# Patient Record
Sex: Male | Born: 1980 | Race: Black or African American | Hispanic: No | Marital: Single | Smoking: Never smoker
Health system: Southern US, Community
[De-identification: ages and names within clinical notes are randomized; demographics above are authoritative.]

---

## 2002-07-15 ENCOUNTER — Encounter: Payer: Self-pay | Admitting: Emergency Medicine

## 2002-07-15 ENCOUNTER — Emergency Department (HOSPITAL_COMMUNITY): Admission: EM | Admit: 2002-07-15 | Discharge: 2002-07-15 | Payer: Self-pay | Admitting: Emergency Medicine

## 2005-01-28 ENCOUNTER — Ambulatory Visit: Payer: Self-pay | Admitting: Family Medicine

## 2005-03-28 ENCOUNTER — Emergency Department (HOSPITAL_COMMUNITY): Admission: EM | Admit: 2005-03-28 | Discharge: 2005-03-29 | Payer: Self-pay | Admitting: Emergency Medicine

## 2005-07-10 ENCOUNTER — Emergency Department (HOSPITAL_COMMUNITY): Admission: EM | Admit: 2005-07-10 | Discharge: 2005-07-10 | Payer: Self-pay | Admitting: Emergency Medicine

## 2005-11-10 ENCOUNTER — Ambulatory Visit: Payer: Self-pay | Admitting: Family Medicine

## 2007-01-04 ENCOUNTER — Ambulatory Visit: Payer: Self-pay | Admitting: Family Medicine

## 2007-01-04 LAB — CONVERTED CEMR LAB
ALT: 20 units/L (ref 0–40)
AST: 22 units/L (ref 0–37)
Albumin: 4.1 g/dL (ref 3.5–5.2)
Alkaline Phosphatase: 67 units/L (ref 39–117)
BUN: 14 mg/dL (ref 6–23)
Bilirubin, Direct: 0.2 mg/dL (ref 0.0–0.3)
CO2: 30 meq/L (ref 19–32)
Calcium: 9.2 mg/dL (ref 8.4–10.5)
Chloride: 107 meq/L (ref 96–112)
Cholesterol: 167 mg/dL (ref 0–200)
Creatinine, Ser: 1.4 mg/dL (ref 0.4–1.5)
GFR calc Af Amer: 78 mL/min
GFR calc non Af Amer: 65 mL/min
Glucose, Bld: 94 mg/dL (ref 70–99)
HCT: 41.9 % (ref 39.0–52.0)
HDL: 36.5 mg/dL — ABNORMAL LOW (ref >39.0)
Hemoglobin: 13.7 g/dL (ref 13.0–17.0)
LDL Cholesterol: 121 mg/dL — ABNORMAL HIGH (ref 0–99)
MCHC: 32.7 g/dL (ref 30.0–36.0)
MCV: 77.4 fL — ABNORMAL LOW (ref 78.0–100.0)
Platelets: 190 10*3/uL (ref 150–400)
Potassium: 3.9 meq/L (ref 3.5–5.1)
RBC: 5.41 M/uL (ref 4.22–5.81)
RDW: 13.1 % (ref 11.5–14.6)
Sodium: 140 meq/L (ref 135–145)
TSH: 0.73 microintl units/mL (ref 0.35–5.50)
Total Bilirubin: 1.1 mg/dL (ref 0.3–1.2)
Total CHOL/HDL Ratio: 4.6
Total Protein: 7.8 g/dL (ref 6.0–8.3)
Triglycerides: 50 mg/dL (ref 0–149)
VLDL: 10 mg/dL (ref 0–40)
WBC: 4.2 10*3/uL — ABNORMAL LOW (ref 4.5–10.5)

## 2008-11-25 ENCOUNTER — Emergency Department (HOSPITAL_COMMUNITY): Admission: EM | Admit: 2008-11-25 | Discharge: 2008-11-25 | Payer: Self-pay | Admitting: Emergency Medicine

## 2009-08-10 ENCOUNTER — Emergency Department (HOSPITAL_COMMUNITY): Admission: EM | Admit: 2009-08-10 | Discharge: 2009-08-10 | Payer: Self-pay | Admitting: Emergency Medicine

## 2010-03-02 ENCOUNTER — Encounter: Payer: Self-pay | Admitting: Family Medicine

## 2010-03-11 ENCOUNTER — Encounter: Admission: RE | Admit: 2010-03-11 | Discharge: 2010-03-11 | Payer: Self-pay | Admitting: Otolaryngology

## 2010-04-07 ENCOUNTER — Encounter: Payer: Self-pay | Admitting: Family Medicine

## 2010-04-17 ENCOUNTER — Emergency Department (HOSPITAL_COMMUNITY)
Admission: EM | Admit: 2010-04-17 | Discharge: 2010-04-17 | Payer: Self-pay | Source: Home / Self Care | Admitting: Emergency Medicine

## 2010-04-21 ENCOUNTER — Encounter: Payer: Self-pay | Admitting: Family Medicine

## 2010-05-17 ENCOUNTER — Encounter: Payer: Self-pay | Admitting: Family Medicine

## 2010-05-24 ENCOUNTER — Ambulatory Visit (HOSPITAL_BASED_OUTPATIENT_CLINIC_OR_DEPARTMENT_OTHER): Admission: RE | Admit: 2010-05-24 | Discharge: 2010-05-24 | Payer: Self-pay | Admitting: Otolaryngology

## 2010-06-01 ENCOUNTER — Encounter: Payer: Self-pay | Admitting: Family Medicine

## 2010-06-15 ENCOUNTER — Encounter: Payer: Self-pay | Admitting: Family Medicine

## 2010-07-06 ENCOUNTER — Encounter: Payer: Self-pay | Admitting: Family Medicine

## 2010-08-10 ENCOUNTER — Ambulatory Visit: Payer: Self-pay | Admitting: Family Medicine

## 2010-08-12 ENCOUNTER — Encounter: Payer: Self-pay | Admitting: Family Medicine

## 2010-09-14 NOTE — Letter (Signed)
Summary: [facility]  [facility]   Imported By: Maryln Gottron 05/03/2010 09:53:39  _____________________________________________________________________  External Attachment:    Type:   Image     Comment:   External Document

## 2010-09-14 NOTE — Consult Note (Signed)
Summary: [place] [facility]  [facility]   Imported By: Maryln Gottron 05/24/2010 11:17:07  _____________________________________________________________________  External Attachment:    Type:   Image     Comment:   External Document

## 2010-09-14 NOTE — Letter (Signed)
Summary: [place] [facility]  [facility]   Imported By: Maryln Gottron 04/16/2010 09:16:46  _____________________________________________________________________  External Attachment:    Type:   Image     Comment:   External Document

## 2010-09-14 NOTE — Letter (Signed)
Summary: [facility]  [facility]   Imported By: Maryln Gottron 06/18/2010 14:42:47  _____________________________________________________________________  External Attachment:    Type:   Image     Comment:   External Document

## 2010-09-14 NOTE — Letter (Signed)
Summary: [facility]  [facility]   Imported By: Maryln Gottron 07/15/2010 12:58:44  _____________________________________________________________________  External Attachment:    Type:   Image     Comment:   External Document

## 2010-09-14 NOTE — Consult Note (Signed)
Summary: [facility]  [facility]   Imported By: Maryln Gottron 06/08/2010 13:57:15  _____________________________________________________________________  External Attachment:    Type:   Image     Comment:   External Document

## 2010-09-14 NOTE — Consult Note (Signed)
Summary:  [facility]  [facility]   Imported By: Maryln Gottron 03/08/2010 12:31:16  _____________________________________________________________________  External Attachment:    Type:   Image     Comment:   External Document

## 2010-09-16 NOTE — Letter (Signed)
Summary: [facility]  [facility]   Imported By: Maryln Gottron 08/25/2010 13:05:12  _____________________________________________________________________  External Attachment:    Type:   Image     Comment:   External Document

## 2010-10-28 LAB — POCT HEMOGLOBIN-HEMACUE: Hemoglobin: 12.3 g/dL — ABNORMAL LOW (ref 13.0–17.0)

## 2010-12-27 ENCOUNTER — Emergency Department (HOSPITAL_COMMUNITY)
Admission: EM | Admit: 2010-12-27 | Discharge: 2010-12-27 | Disposition: A | Discharge status: Home / Self Care | Payer: No Typology Code available for payment source | Attending: Emergency Medicine | Admitting: Emergency Medicine

## 2010-12-27 DIAGNOSIS — M25519 Pain in unspecified shoulder: Secondary | ICD-10-CM | POA: Insufficient documentation

## 2010-12-27 DIAGNOSIS — M546 Pain in thoracic spine: Secondary | ICD-10-CM | POA: Insufficient documentation

## 2010-12-27 DIAGNOSIS — R51 Headache: Secondary | ICD-10-CM | POA: Insufficient documentation

## 2010-12-27 DIAGNOSIS — T148XXA Other injury of unspecified body region, initial encounter: Secondary | ICD-10-CM | POA: Insufficient documentation

## 2010-12-27 DIAGNOSIS — M542 Cervicalgia: Secondary | ICD-10-CM | POA: Insufficient documentation

## 2010-12-27 DIAGNOSIS — M2560 Stiffness of unspecified joint, not elsewhere classified: Secondary | ICD-10-CM | POA: Insufficient documentation

## 2010-12-28 NOTE — Assessment & Plan Note (Signed)
[facility]                            [facility] OFFICE NOTE   NAME:Gipson, DAVEON                       MRN:          [id number]  DATE:01/04/2007                            DOB:          Dec 23, 1980    This is a 30 year old gentleman here complete physical exam.   In general, he feels fine.  He does ask a question about how to treat  hair loss.  Over the past year, he has had rapidly thinning hair around  the temples and on the top of his head.  Otherwise, for details of his  past medical history, family history, social history, habits refer to  our list with physical note dated Feb 07, 2005.   ALLERGIES:  No known drug allergies.   MEDICATIONS:  None.   OBJECTIVE:  VITAL SIGNS:  Height 5 feet 6 inches, weight 187, BP 108/82,  pulse 76 and regular.  GENERAL:  He appears to be healthy.  SKIN:  Clear.  He does have some thinning of his hair at the apex of the  scalp as well as the temples.  HEENT:  Eyes clear.  Oropharynx clear.  NECK:  Supple without lymphadenopathy or masses.  LUNGS:  Clear.  CARDIAC:  Regular rate and rhythm without murmurs, rubs or gallops.  Pulses full.  ABDOMEN:  Soft, normal bowel sounds, nontender, no masses.  GENITALIA:  Circumcised.  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  EXTREMITIES:  No clubbing, cyanosis or edema.  NEUROLOGIC:  Grossly intact.   ASSESSMENT/PLAN:  1. Complete physical.  He is fasting, so we will get the usual      laboratories.  2. Genital wart.  Will begin Aldara 5% cream to apply every other day      until clear.  We advised him to use condoms in the meantime.  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  Will begin Propecia 1 mg per day.     Tera Mater. Clent Ridges, MD  Electronically Signed    SAF/MedQ  DD: 01/04/2007  DT: 01/04/2007  Job #: [id number]

## 2010-12-31 ENCOUNTER — Emergency Department (HOSPITAL_COMMUNITY)
Admission: EM | Admit: 2010-12-31 | Discharge: 2010-12-31 | Disposition: A | Discharge status: Home / Self Care | Payer: No Typology Code available for payment source | Attending: Emergency Medicine | Admitting: Emergency Medicine

## 2010-12-31 DIAGNOSIS — M549 Dorsalgia, unspecified: Secondary | ICD-10-CM | POA: Insufficient documentation

## 2010-12-31 DIAGNOSIS — T148XXA Other injury of unspecified body region, initial encounter: Secondary | ICD-10-CM | POA: Insufficient documentation

## 2010-12-31 DIAGNOSIS — Y92410 Unspecified street and highway as the place of occurrence of the external cause: Secondary | ICD-10-CM | POA: Insufficient documentation

## 2011-02-20 ENCOUNTER — Emergency Department (HOSPITAL_COMMUNITY)
Admission: EM | Admit: 2011-02-20 | Discharge: 2011-02-20 | Disposition: A | Discharge status: Home / Self Care | Payer: PRIVATE HEALTH INSURANCE | Attending: Emergency Medicine | Admitting: Emergency Medicine

## 2011-02-20 ENCOUNTER — Emergency Department (HOSPITAL_COMMUNITY): Payer: PRIVATE HEALTH INSURANCE

## 2011-02-20 DIAGNOSIS — R059 Cough, unspecified: Secondary | ICD-10-CM | POA: Insufficient documentation

## 2011-02-20 DIAGNOSIS — R079 Chest pain, unspecified: Secondary | ICD-10-CM | POA: Insufficient documentation

## 2011-02-20 DIAGNOSIS — R0602 Shortness of breath: Secondary | ICD-10-CM | POA: Insufficient documentation

## 2011-02-20 DIAGNOSIS — J40 Bronchitis, not specified as acute or chronic: Secondary | ICD-10-CM | POA: Insufficient documentation

## 2011-02-20 DIAGNOSIS — R05 Cough: Secondary | ICD-10-CM | POA: Insufficient documentation

## 2012-12-06 ENCOUNTER — Ambulatory Visit (INDEPENDENT_AMBULATORY_CARE_PROVIDER_SITE_OTHER): Payer: BC Managed Care – PPO | Admitting: Family Medicine

## 2012-12-06 ENCOUNTER — Encounter: Payer: Self-pay | Admitting: Family Medicine

## 2012-12-06 VITALS — BP 120/84 | HR 86 | Temp 98.6°F | Wt 215.0 lb

## 2012-12-06 DIAGNOSIS — J019 Acute sinusitis, unspecified: Secondary | ICD-10-CM

## 2012-12-06 MED ORDER — AZITHROMYCIN 250 MG PO TABS: ORAL_TABLET | ORAL | Status: DC

## 2012-12-06 NOTE — Progress Notes (Signed)
  Subjective:    Patient ID: Curtis Parks, male    DOB: [birth year], 32 y.o.   MRN: [id number]  HPI Here for one week of sinus pressure, PND, ST, and a dry cough. No fever. On Claritin.    Review of Systems  Constitutional: Negative.   HENT: Positive for congestion, postnasal drip and sinus pressure.   Eyes: Negative.   Respiratory: Positive for cough.        Objective:   Physical Exam  Constitutional: He appears well-developed and well-nourished.  HENT:  Right Ear: External ear normal.  Left Ear: External ear normal.  Nose: Nose normal.  Mouth/Throat: Oropharynx is clear and moist.  Eyes: Conjunctivae are normal.  Pulmonary/Chest: Effort normal and breath sounds normal.  Lymphadenopathy:    He has no cervical adenopathy.          Assessment & Plan:  Recheck prn

## 2014-02-28 ENCOUNTER — Ambulatory Visit (INDEPENDENT_AMBULATORY_CARE_PROVIDER_SITE_OTHER): Payer: BC Managed Care – PPO | Admitting: Family Medicine

## 2014-02-28 ENCOUNTER — Encounter: Payer: Self-pay | Admitting: Family Medicine

## 2014-02-28 VITALS — BP 135/80 | HR 76 | Temp 98.4°F | Ht 66.0 in | Wt 219.0 lb

## 2014-02-28 DIAGNOSIS — J0100 Acute maxillary sinusitis, unspecified: Secondary | ICD-10-CM

## 2014-02-28 MED ORDER — AZITHROMYCIN 250 MG PO TABS: ORAL_TABLET | ORAL | Status: DC

## 2014-02-28 NOTE — Progress Notes (Signed)
   Subjective:    Patient ID: Curtis Parks, male    DOB: 1980/09/25, 33 y.o.   MRN: [id number]  HPI Here for 2 weeks of sinus pressure, PND, and HA. No cough or fever.    Review of Systems  Constitutional: Negative.   HENT: Positive for congestion, postnasal drip and sinus pressure.   Eyes: Negative.   Respiratory: Negative.        Objective:   Physical Exam  Constitutional: He appears well-developed and well-nourished.  HENT:  Right Ear: External ear normal.  Left Ear: External ear normal.  Nose: Nose normal.  Mouth/Throat: Oropharynx is clear and moist.  Eyes: Conjunctivae are normal.  Pulmonary/Chest: Effort normal and breath sounds normal.  Lymphadenopathy:    He has no cervical adenopathy.          Assessment & Plan:  Add Mucinex

## 2014-02-28 NOTE — Progress Notes (Signed)
Pre visit review using our clinic review tool, if applicable. No additional management support is needed unless otherwise documented below in the visit note. 

## 2014-03-13 ENCOUNTER — Telehealth: Payer: Self-pay | Admitting: Family Medicine

## 2014-03-13 NOTE — Telephone Encounter (Signed)
Patient Information:  Caller Name: Neill  Phone: [phone]  Patient: Graycen, Sadlon  Gender: Male  DOB: 1981/07/31  Age: 33 Years  PCP: Alysia Penna [facility])  Office Follow Up:  Does the office need to follow up with this patient?: No  Instructions For The Office: N/A  RN Note:  Pt calling after developing Knot inside Umbilical area, denies redness, oozing or pain.  Pt states, Knot feels fluid filled but unable to see Knot.  Afebrile. All emergent sxs ruled out per Skin Lesion protocol, see w/n 2 weeks d/t caller uncertain what it is.  Pt has appt on 8-6 prior to triage.  Advised Pt to call back if sxs change, apply warm compresses, advised not to squeeze.  Pt verbalized understanding.  Symptoms  Reason For Call & Symptoms: Knot at [employer], onset 1 week.  Reviewed Health History In EMR: Yes  Reviewed Medications In EMR: Yes  Reviewed Allergies In EMR: Yes  Reviewed Surgeries / Procedures: Yes  Date of Onset of Symptoms: 03/06/2014  Guideline(s) Used:  Skin Lesion - Moles or Growths  Disposition Per Guideline:   See Within 2 Weeks in Office  Reason For Disposition Reached:   Caller is uncertain what it is  Advice Given:  N/A  Patient Will Follow Care Advice:  YES

## 2014-03-13 NOTE — Telephone Encounter (Signed)
Noted  

## 2014-03-20 ENCOUNTER — Encounter: Payer: Self-pay | Admitting: Family Medicine

## 2014-03-20 ENCOUNTER — Ambulatory Visit (INDEPENDENT_AMBULATORY_CARE_PROVIDER_SITE_OTHER): Payer: BC Managed Care – PPO | Admitting: Family Medicine

## 2014-03-20 VITALS — BP 112/70 | HR 79 | Temp 98.3°F | Ht 66.0 in | Wt 216.0 lb

## 2014-03-20 DIAGNOSIS — D239 Other benign neoplasm of skin, unspecified: Secondary | ICD-10-CM

## 2014-03-20 DIAGNOSIS — K429 Umbilical hernia without obstruction or gangrene: Secondary | ICD-10-CM

## 2014-03-20 NOTE — Progress Notes (Signed)
   Subjective:    Patient ID: Curtis Parks, male    DOB: 14-Apr-1981, 33 y.o.   MRN: [id number]  HPI Here to check 2 things. First he has had a non-symptomatic lesion on the right side for 6 months and he asks what it is. Also 4 days ago he noticed a bulge around his belly button that never caused pain, and then it went back down. No bowel or urinary problems.    Review of Systems  Constitutional: Negative.   Gastrointestinal: Negative.   Genitourinary: Negative.        Objective:   Physical Exam  Constitutional: He appears well-developed and well-nourished.  Abdominal: Soft. Bowel sounds are normal. He exhibits no distension. There is no tenderness. There is no rebound and no guarding.  Small reducible non-tender hernia at the umbilicus   Skin:  Dermatofibroma on the right flank, firm, dark brown color           Assessment & Plan:  Reassured. We will watch the hernia and he knows to return if it gets any bigger or becomes painful.

## 2014-03-20 NOTE — Progress Notes (Signed)
Pre visit review using our clinic review tool, if applicable. No additional management support is needed unless otherwise documented below in the visit note. 

## 2015-05-25 ENCOUNTER — Other Ambulatory Visit (INDEPENDENT_AMBULATORY_CARE_PROVIDER_SITE_OTHER): Payer: BLUE CROSS/BLUE SHIELD

## 2015-05-25 DIAGNOSIS — Z0000 Encounter for general adult medical examination without abnormal findings: Secondary | ICD-10-CM | POA: Diagnosis not present

## 2015-05-25 LAB — BASIC METABOLIC PANEL
BUN: 14 mg/dL (ref 6–23)
CALCIUM: 9.6 mg/dL (ref 8.4–10.5)
CHLORIDE: 101 meq/L (ref 96–112)
CO2: 31 meq/L (ref 19–32)
Creatinine, Ser: 1.42 mg/dL (ref 0.40–1.50)
GFR: 72.77 mL/min (ref >60.00)
GLUCOSE: 97 mg/dL (ref 70–99)
POTASSIUM: 4.4 meq/L (ref 3.5–5.1)
SODIUM: 138 meq/L (ref 135–145)

## 2015-05-25 LAB — LIPID PANEL
CHOLESTEROL: 148 mg/dL (ref 0–200)
HDL: 37 mg/dL — ABNORMAL LOW (ref >39.00)
LDL Cholesterol: 94 mg/dL (ref 0–99)
NONHDL: 110.7
Total CHOL/HDL Ratio: 4
Triglycerides: 84 mg/dL (ref 0.0–149.0)
VLDL: 16.8 mg/dL (ref 0.0–40.0)

## 2015-05-25 LAB — CBC WITH DIFFERENTIAL/PLATELET
BASOS ABS: 0 10*3/uL (ref 0.0–0.1)
BASOS PCT: 0.3 % (ref 0.0–3.0)
EOS PCT: 0.4 % (ref 0.0–5.0)
Eosinophils Absolute: 0 10*3/uL (ref 0.0–0.7)
HEMATOCRIT: 40.9 % (ref 39.0–52.0)
Hemoglobin: 13.1 g/dL (ref 13.0–17.0)
LYMPHS ABS: 2.4 10*3/uL (ref 0.7–4.0)
LYMPHS PCT: 42.4 % (ref 12.0–46.0)
MCHC: 32 g/dL (ref 30.0–36.0)
MCV: 79.2 fl (ref 78.0–100.0)
MONOS PCT: 8.9 % (ref 3.0–12.0)
Monocytes Absolute: 0.5 10*3/uL (ref 0.1–1.0)
NEUTROS ABS: 2.7 10*3/uL (ref 1.4–7.7)
NEUTROS PCT: 48 % (ref 43.0–77.0)
PLATELETS: 166 10*3/uL (ref 150.0–400.0)
RBC: 5.16 Mil/uL (ref 4.22–5.81)
RDW: 15.1 % (ref 11.5–15.5)
WBC: 5.6 10*3/uL (ref 4.0–10.5)

## 2015-05-25 LAB — POCT URINALYSIS DIPSTICK
GLUCOSE UA: NEGATIVE
Ketones, UA: NEGATIVE
Leukocytes, UA: NEGATIVE
Nitrite, UA: NEGATIVE
PH UA: 5.5
RBC UA: NEGATIVE
UROBILINOGEN UA: 1

## 2015-05-25 LAB — HEPATIC FUNCTION PANEL
ALBUMIN: 4.3 g/dL (ref 3.5–5.2)
ALK PHOS: 64 U/L (ref 39–117)
ALT: 17 U/L (ref 0–53)
AST: 16 U/L (ref 0–37)
Bilirubin, Direct: 0.3 mg/dL (ref 0.0–0.3)
TOTAL PROTEIN: 7.8 g/dL (ref 6.0–8.3)
Total Bilirubin: 1.2 mg/dL (ref 0.2–1.2)

## 2015-05-25 LAB — TSH: TSH: 1.97 u[IU]/mL (ref 0.35–4.50)

## 2015-06-01 ENCOUNTER — Ambulatory Visit (INDEPENDENT_AMBULATORY_CARE_PROVIDER_SITE_OTHER): Payer: BLUE CROSS/BLUE SHIELD | Admitting: Family Medicine

## 2015-06-01 ENCOUNTER — Encounter: Payer: Self-pay | Admitting: Family Medicine

## 2015-06-01 VITALS — BP 122/72 | HR 82 | Temp 98.5°F | Ht 66.0 in | Wt 208.0 lb

## 2015-06-01 DIAGNOSIS — Z0000 Encounter for general adult medical examination without abnormal findings: Secondary | ICD-10-CM | POA: Diagnosis not present

## 2015-06-01 DIAGNOSIS — Z113 Encounter for screening for infections with a predominantly sexual mode of transmission: Secondary | ICD-10-CM | POA: Diagnosis not present

## 2015-06-01 DIAGNOSIS — Z202 Contact with and (suspected) exposure to infections with a predominantly sexual mode of transmission: Secondary | ICD-10-CM

## 2015-06-01 NOTE — Progress Notes (Signed)
   Subjective:    Patient ID: Curtis Parks, male    DOB: [birth year], 34 y.o.   MRN: [id number]  HPI 34 yr old male for a cpx. He feels well and has no complaints. He does ask for a general STD screen. Since last year he has stopped drinking all sodas and he has greatly reduced his intake of fast foods. He has lost some weight.    Review of Systems  Constitutional: Negative.   HENT: Negative.   Eyes: Negative.   Respiratory: Negative.   Cardiovascular: Negative.   Gastrointestinal: Negative.   Genitourinary: Negative.   Musculoskeletal: Negative.   Skin: Negative.   Neurological: Negative.   Psychiatric/Behavioral: Negative.        Objective:   Physical Exam  Constitutional: He is oriented to person, place, and time. He appears well-developed and well-nourished. No distress.  HENT:  Head: Normocephalic and atraumatic.  Right Ear: External ear normal.  Left Ear: External ear normal.  Nose: Nose normal.  Mouth/Throat: Oropharynx is clear and moist. No oropharyngeal exudate.  Eyes: Conjunctivae and EOM are normal. Pupils are equal, round, and reactive to light. Right eye exhibits no discharge. Left eye exhibits no discharge. No scleral icterus.  Neck: Neck supple. No JVD present. No tracheal deviation present. No thyromegaly present.  Cardiovascular: Normal rate, regular rhythm, normal heart sounds and intact distal pulses.  Exam reveals no gallop and no friction rub.   No murmur heard. Pulmonary/Chest: Effort normal and breath sounds normal. No respiratory distress. He has no wheezes. He has no rales. He exhibits no tenderness.  Abdominal: Soft. Bowel sounds are normal. He exhibits no distension and no mass. There is no tenderness. There is no rebound and no guarding.  Genitourinary: Rectum normal, prostate normal and penis normal. Guaiac negative stool. No penile tenderness.  Musculoskeletal: Normal range of motion. He exhibits no edema or tenderness.  Lymphadenopathy:    He  has no cervical adenopathy.  Neurological: He is alert and oriented to person, place, and time. He has normal reflexes. No cranial nerve deficit. He exhibits normal muscle tone. Coordination normal.  Skin: Skin is warm and dry. No rash noted. He is not diaphoretic. No erythema. No pallor.  Psychiatric: He has a normal mood and affect. His behavior is normal. Judgment and thought content normal.          Assessment & Plan:  Well exam. We discussed more diet and exercise advice. Screen for STDs.

## 2015-06-01 NOTE — Progress Notes (Signed)
Pre visit review using our clinic review tool, if applicable. No additional management support is needed unless otherwise documented below in the visit note. 

## 2015-06-02 LAB — GC/CHLAMYDIA PROBE AMP, URINE
Chlamydia, Swab/Urine, PCR: NEGATIVE
GC PROBE AMP, URINE: NEGATIVE

## 2015-06-02 LAB — RPR

## 2015-06-02 LAB — HSV(HERPES SIMPLEX VRS) I + II AB-IGG: HSV 2 Glycoprotein G Ab, IgG: 4.24 IV — ABNORMAL HIGH

## 2015-06-02 LAB — HIV ANTIBODY (ROUTINE TESTING W REFLEX): HIV: NONREACTIVE

## 2015-06-02 LAB — HEPATITIS C ANTIBODY: HCV AB: NEGATIVE

## 2015-06-02 LAB — HEPATITIS B SURFACE ANTIGEN: HEP B S AG: NEGATIVE

## 2017-05-04 ENCOUNTER — Encounter: Payer: Self-pay | Admitting: Family Medicine

## 2017-06-12 ENCOUNTER — Emergency Department (HOSPITAL_COMMUNITY)
Admission: EM | Admit: 2017-06-12 | Discharge: 2017-06-12 | Disposition: A | Discharge status: Home / Self Care | Payer: BLUE CROSS/BLUE SHIELD | Attending: Emergency Medicine | Admitting: Emergency Medicine

## 2017-06-12 ENCOUNTER — Emergency Department (HOSPITAL_COMMUNITY): Payer: BLUE CROSS/BLUE SHIELD

## 2017-06-12 ENCOUNTER — Encounter (HOSPITAL_COMMUNITY): Payer: Self-pay

## 2017-06-12 DIAGNOSIS — M79644 Pain in right finger(s): Secondary | ICD-10-CM | POA: Insufficient documentation

## 2017-06-12 DIAGNOSIS — Y92410 Unspecified street and highway as the place of occurrence of the external cause: Secondary | ICD-10-CM | POA: Diagnosis not present

## 2017-06-12 DIAGNOSIS — S46811A Strain of other muscles, fascia and tendons at shoulder and upper arm level, right arm, initial encounter: Secondary | ICD-10-CM | POA: Diagnosis not present

## 2017-06-12 DIAGNOSIS — Y9389 Activity, other specified: Secondary | ICD-10-CM | POA: Diagnosis not present

## 2017-06-12 DIAGNOSIS — S4991XA Unspecified injury of right shoulder and upper arm, initial encounter: Secondary | ICD-10-CM | POA: Diagnosis present

## 2017-06-12 DIAGNOSIS — M7918 Myalgia, other site: Secondary | ICD-10-CM | POA: Diagnosis not present

## 2017-06-12 DIAGNOSIS — Y999 Unspecified external cause status: Secondary | ICD-10-CM | POA: Insufficient documentation

## 2017-06-12 MED ORDER — METHOCARBAMOL 500 MG PO TABS: 500.0000 mg | ORAL_TABLET | Freq: Two times a day (BID) | ORAL | 0 refills | Status: DC
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
## 2017-06-12 NOTE — ED Notes (Signed)
Patient Alert and oriented X4. Stable and ambulatory. Patient verbalized understanding of the discharge instructions.  Patient belongings were taken by the patient.  

## 2018-11-02 ENCOUNTER — Telehealth: Payer: Self-pay

## 2018-11-02 ENCOUNTER — Encounter: Payer: Self-pay | Admitting: Family Medicine

## 2018-11-02 NOTE — Telephone Encounter (Signed)
Tried to call pt to reschedule was unable to complete call or leave voicemail

## 2018-11-07 ENCOUNTER — Encounter: Payer: Self-pay | Admitting: Family Medicine

## 2019-02-05 ENCOUNTER — Encounter: Payer: Self-pay | Admitting: Family Medicine

## 2019-05-09 ENCOUNTER — Other Ambulatory Visit: Payer: Self-pay

## 2019-05-09 DIAGNOSIS — Z20822 Contact with and (suspected) exposure to covid-19: Secondary | ICD-10-CM

## 2019-05-10 LAB — NOVEL CORONAVIRUS, NAA: SARS-CoV-2, NAA: NOT DETECTED

## 2020-02-18 ENCOUNTER — Encounter: Payer: 59 | Admitting: Family Medicine

## 2020-03-04 ENCOUNTER — Ambulatory Visit (INDEPENDENT_AMBULATORY_CARE_PROVIDER_SITE_OTHER): Payer: BC Managed Care – PPO | Admitting: Family Medicine

## 2020-03-04 ENCOUNTER — Other Ambulatory Visit: Payer: Self-pay | Admitting: Family Medicine

## 2020-03-04 ENCOUNTER — Other Ambulatory Visit: Payer: Self-pay

## 2020-03-04 ENCOUNTER — Encounter: Payer: Self-pay | Admitting: Family Medicine

## 2020-03-04 VITALS — BP 120/80 | HR 87 | Temp 97.6°F | Ht 65.0 in | Wt 235.0 lb

## 2020-03-04 DIAGNOSIS — Z0000 Encounter for general adult medical examination without abnormal findings: Secondary | ICD-10-CM | POA: Diagnosis not present

## 2020-03-04 DIAGNOSIS — A6000 Herpesviral infection of urogenital system, unspecified: Secondary | ICD-10-CM | POA: Insufficient documentation

## 2020-03-04 MED ORDER — VALACYCLOVIR HCL 500 MG PO TABS: 500.0000 mg | ORAL_TABLET | Freq: Two times a day (BID) | ORAL | 2 refills | Status: DC

## 2020-03-04 NOTE — Progress Notes (Signed)
Subjective:    Patient ID: Curtis Parks, male    DOB: 01-21-1981, 39 y.o.   MRN: [id number]  HPI Here for a well exam. We have not seen him for 5 years. He has a small umbilical hernia but he says it never bothers him. He now has twin 64 year olds. 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 He mentions he had a herpes outbreak a few weeks ago (the first one in years) and he asks if he can have something to take if he has another one.      Review of Systems  Constitutional: Negative.   HENT: Negative.   Eyes: Negative.   Respiratory: Negative.   Cardiovascular: Negative.   Gastrointestinal: Negative.   Genitourinary: Negative.   Musculoskeletal: Negative.   Skin: Negative.   Neurological: Negative.   Psychiatric/Behavioral: Negative.        Objective:   Physical Exam Constitutional:      General: He is not in acute distress.    Appearance: He is well-developed. He is not diaphoretic.  HENT:     Head: Normocephalic and atraumatic.     Right Ear: External ear normal.     Left Ear: External ear normal.     Nose: Nose normal.     Mouth/Throat:     Pharynx: No oropharyngeal exudate.  Eyes:     General: No scleral icterus.       Right eye: No discharge.        Left eye: No discharge.     Conjunctiva/sclera: Conjunctivae normal.     Pupils: Pupils are equal, round, and reactive to light.  Neck:     Thyroid: No thyromegaly.     Vascular: No JVD.     Trachea: No tracheal deviation.  Cardiovascular:     Rate and Rhythm: Normal rate and regular rhythm.     Heart sounds: Normal heart sounds. No murmur heard.  No friction rub. No gallop.   Pulmonary:     Effort: Pulmonary effort is normal. No respiratory distress.     Breath sounds: Normal breath sounds. No wheezing or rales.  Chest:     Chest wall: No tenderness.  Abdominal:     General: Bowel sounds are normal. There is no distension.     Palpations: Abdomen is soft. There is no mass.      Tenderness: There is no abdominal tenderness. There is no guarding or rebound.     Comments: Small easily reducible non-tender umbilical hernia   Genitourinary:    Penis: Normal. No tenderness.      Testes: Normal.     Prostate: Normal.     Rectum: Normal.  Musculoskeletal:        General: No tenderness. Normal range of motion.     Cervical back: Neck supple.  Lymphadenopathy:     Cervical: No cervical adenopathy.  Skin:    General: Skin is warm and dry.     Coloration: Skin is not pale.     Findings: No erythema or rash.  Neurological:     Mental Status: He is alert and oriented to person, place, and time.     Cranial Nerves: No cranial nerve deficit.     Motor: No abnormal muscle tone.     Coordination: Coordination normal.     Deep Tendon Reflexes: Reflexes are normal and symmetric. Reflexes normal.  Psychiatric:        Behavior: Behavior normal.  Thought Content: Thought content normal.        Judgment: Judgment normal.           Assessment & Plan:  Well exam. We discussed diet and exercise. Get fasting labs. We will continue to watch the hernia. For herpes outbreaks, he can try Valtrex prn. Alysia Penna, MD

## 2020-03-05 LAB — BASIC METABOLIC PANEL
BUN/Creatinine Ratio: 9 (calc) (ref 6–22)
BUN: 13 mg/dL (ref 7–25)
CO2: 28 mmol/L (ref 20–32)
Calcium: 9.6 mg/dL (ref 8.6–10.3)
Chloride: 103 mmol/L (ref 98–110)
Creat: 1.47 mg/dL — ABNORMAL HIGH (ref 0.60–1.35)
Glucose, Bld: 115 mg/dL — ABNORMAL HIGH (ref 65–99)
Potassium: 4.2 mmol/L (ref 3.5–5.3)
Sodium: 139 mmol/L (ref 135–146)

## 2020-03-05 LAB — HEPATIC FUNCTION PANEL
AG Ratio: 1.3 (calc) (ref 1.0–2.5)
ALT: 27 U/L (ref 9–46)
AST: 19 U/L (ref 10–40)
Albumin: 4.4 g/dL (ref 3.6–5.1)
Alkaline phosphatase (APISO): 65 U/L (ref 36–130)
Bilirubin, Direct: 0.2 mg/dL (ref 0.0–0.2)
Globulin: 3.3 g/dL (calc) (ref 1.9–3.7)
Indirect Bilirubin: 0.7 mg/dL (calc) (ref 0.2–1.2)
Total Bilirubin: 0.9 mg/dL (ref 0.2–1.2)
Total Protein: 7.7 g/dL (ref 6.1–8.1)

## 2020-03-05 LAB — LIPID PANEL
Cholesterol: 173 mg/dL (ref <200)
HDL: 37 mg/dL — ABNORMAL LOW (ref >=40)
LDL Cholesterol (Calc): 119 mg/dL (calc) — ABNORMAL HIGH
Non-HDL Cholesterol (Calc): 136 mg/dL (calc) — ABNORMAL HIGH (ref <130)
Total CHOL/HDL Ratio: 4.7 (calc) (ref <5.0)
Triglycerides: 74 mg/dL (ref <150)

## 2020-03-05 LAB — CBC WITH DIFFERENTIAL/PLATELET
Absolute Monocytes: 287 cells/uL (ref 200–950)
Basophils Absolute: 21 cells/uL (ref 0–200)
Basophils Relative: 0.6 %
Eosinophils Absolute: 21 cells/uL (ref 15–500)
Eosinophils Relative: 0.6 %
HCT: 41 % (ref 38.5–50.0)
Hemoglobin: 12.7 g/dL — ABNORMAL LOW (ref 13.2–17.1)
Lymphs Abs: 1502 cells/uL (ref 850–3900)
MCH: 25 pg — ABNORMAL LOW (ref 27.0–33.0)
MCHC: 31 g/dL — ABNORMAL LOW (ref 32.0–36.0)
MCV: 80.9 fL (ref 80.0–100.0)
MPV: 11.4 fL (ref 7.5–12.5)
Monocytes Relative: 8.2 %
Neutro Abs: 1670 cells/uL (ref 1500–7800)
Neutrophils Relative %: 47.7 %
Platelets: 203 10*3/uL (ref 140–400)
RBC: 5.07 10*6/uL (ref 4.20–5.80)
RDW: 14.1 % (ref 11.0–15.0)
Total Lymphocyte: 42.9 %
WBC: 3.5 10*3/uL — ABNORMAL LOW (ref 3.8–10.8)

## 2020-03-05 LAB — TSH: TSH: 1.06 mIU/L (ref 0.40–4.50)

## 2020-03-05 LAB — PSA: PSA: 0.3 ng/mL (ref <=4.0)

## 2020-05-03 ENCOUNTER — Emergency Department (HOSPITAL_COMMUNITY): Payer: BC Managed Care – PPO

## 2020-05-03 ENCOUNTER — Emergency Department (HOSPITAL_COMMUNITY)
Admission: EM | Admit: 2020-05-03 | Discharge: 2020-05-04 | Disposition: A | Discharge status: Home / Self Care | Payer: BC Managed Care – PPO | Attending: Emergency Medicine | Admitting: Emergency Medicine

## 2020-05-03 ENCOUNTER — Other Ambulatory Visit: Payer: Self-pay

## 2020-05-03 ENCOUNTER — Encounter (HOSPITAL_COMMUNITY): Payer: Self-pay | Admitting: *Deleted

## 2020-05-03 DIAGNOSIS — S43005A Unspecified dislocation of left shoulder joint, initial encounter: Secondary | ICD-10-CM | POA: Diagnosis not present

## 2020-05-03 DIAGNOSIS — S0003XA Contusion of scalp, initial encounter: Secondary | ICD-10-CM | POA: Diagnosis not present

## 2020-05-03 DIAGNOSIS — S81012A Laceration without foreign body, left knee, initial encounter: Secondary | ICD-10-CM

## 2020-05-03 DIAGNOSIS — M25422 Effusion, left elbow: Secondary | ICD-10-CM | POA: Diagnosis not present

## 2020-05-03 DIAGNOSIS — Z20822 Contact with and (suspected) exposure to covid-19: Secondary | ICD-10-CM | POA: Insufficient documentation

## 2020-05-03 DIAGNOSIS — S43102A Unspecified dislocation of left acromioclavicular joint, initial encounter: Secondary | ICD-10-CM | POA: Diagnosis not present

## 2020-05-03 DIAGNOSIS — S52125A Nondisplaced fracture of head of left radius, initial encounter for closed fracture: Secondary | ICD-10-CM | POA: Insufficient documentation

## 2020-05-03 DIAGNOSIS — I10 Essential (primary) hypertension: Secondary | ICD-10-CM | POA: Diagnosis not present

## 2020-05-03 DIAGNOSIS — M25462 Effusion, left knee: Secondary | ICD-10-CM | POA: Diagnosis not present

## 2020-05-03 DIAGNOSIS — M7989 Other specified soft tissue disorders: Secondary | ICD-10-CM | POA: Diagnosis not present

## 2020-05-03 DIAGNOSIS — S43002A Unspecified subluxation of left shoulder joint, initial encounter: Secondary | ICD-10-CM | POA: Diagnosis not present

## 2020-05-03 DIAGNOSIS — S0990XA Unspecified injury of head, initial encounter: Secondary | ICD-10-CM | POA: Diagnosis not present

## 2020-05-03 DIAGNOSIS — S3993XA Unspecified injury of pelvis, initial encounter: Secondary | ICD-10-CM | POA: Diagnosis not present

## 2020-05-03 DIAGNOSIS — T1490XA Injury, unspecified, initial encounter: Secondary | ICD-10-CM

## 2020-05-03 DIAGNOSIS — S299XXA Unspecified injury of thorax, initial encounter: Secondary | ICD-10-CM | POA: Diagnosis not present

## 2020-05-03 DIAGNOSIS — S199XXA Unspecified injury of neck, initial encounter: Secondary | ICD-10-CM | POA: Diagnosis not present

## 2020-05-03 DIAGNOSIS — M47812 Spondylosis without myelopathy or radiculopathy, cervical region: Secondary | ICD-10-CM | POA: Diagnosis not present

## 2020-05-03 DIAGNOSIS — R220 Localized swelling, mass and lump, head: Secondary | ICD-10-CM | POA: Diagnosis not present

## 2020-05-03 DIAGNOSIS — M2578 Osteophyte, vertebrae: Secondary | ICD-10-CM | POA: Diagnosis not present

## 2020-05-03 DIAGNOSIS — S8992XA Unspecified injury of left lower leg, initial encounter: Secondary | ICD-10-CM | POA: Diagnosis not present

## 2020-05-03 LAB — COMPREHENSIVE METABOLIC PANEL
ALT: 28 U/L (ref 0–44)
AST: 29 U/L (ref 15–41)
Albumin: 4.2 g/dL (ref 3.5–5.0)
Alkaline Phosphatase: 61 U/L (ref 38–126)
Anion gap: 12 (ref 5–15)
BUN: 10 mg/dL (ref 6–20)
CO2: 25 mmol/L (ref 22–32)
Calcium: 9.1 mg/dL (ref 8.9–10.3)
Chloride: 101 mmol/L (ref 98–111)
Creatinine, Ser: 1.62 mg/dL — ABNORMAL HIGH (ref 0.61–1.24)
GFR calc Af Amer: >60 mL/min (ref >60)
GFR calc non Af Amer: 52 mL/min — ABNORMAL LOW (ref >60)
Glucose, Bld: 122 mg/dL — ABNORMAL HIGH (ref 70–99)
Potassium: 3.1 mmol/L — ABNORMAL LOW (ref 3.5–5.1)
Sodium: 138 mmol/L (ref 135–145)
Total Bilirubin: 0.8 mg/dL (ref 0.3–1.2)
Total Protein: 7.6 g/dL (ref 6.5–8.1)

## 2020-05-03 LAB — I-STAT CHEM 8, ED
BUN: 12 mg/dL (ref 6–20)
Calcium, Ion: 1.1 mmol/L — ABNORMAL LOW (ref 1.15–1.40)
Chloride: 102 mmol/L (ref 98–111)
Creatinine, Ser: 1.5 mg/dL — ABNORMAL HIGH (ref 0.61–1.24)
Glucose, Bld: 117 mg/dL — ABNORMAL HIGH (ref 70–99)
HCT: 41 % (ref 39.0–52.0)
Hemoglobin: 13.9 g/dL (ref 13.0–17.0)
Potassium: 3.2 mmol/L — ABNORMAL LOW (ref 3.5–5.1)
Sodium: 140 mmol/L (ref 135–145)
TCO2: 24 mmol/L (ref 22–32)

## 2020-05-03 LAB — CBC
HCT: 40.3 % (ref 39.0–52.0)
Hemoglobin: 12.6 g/dL — ABNORMAL LOW (ref 13.0–17.0)
MCH: 25 pg — ABNORMAL LOW (ref 26.0–34.0)
MCHC: 31.3 g/dL (ref 30.0–36.0)
MCV: 79.8 fL — ABNORMAL LOW (ref 80.0–100.0)
Platelets: 222 10*3/uL (ref 150–400)
RBC: 5.05 MIL/uL (ref 4.22–5.81)
RDW: 13.7 % (ref 11.5–15.5)
WBC: 7.3 10*3/uL (ref 4.0–10.5)
nRBC: 0 % (ref 0.0–0.2)

## 2020-05-03 LAB — SAMPLE TO BLOOD BANK

## 2020-05-03 LAB — ETHANOL: Alcohol, Ethyl (B): <10 mg/dL (ref <10)

## 2020-05-03 LAB — LACTIC ACID, PLASMA: Lactic Acid, Venous: 2.9 mmol/L (ref 0.5–1.9)

## 2020-05-03 LAB — PROTIME-INR
INR: 1.1 (ref 0.8–1.2)
Prothrombin Time: 13.7 seconds (ref 11.4–15.2)

## 2020-05-03 LAB — SARS CORONAVIRUS 2 BY RT PCR (HOSPITAL ORDER, PERFORMED IN ~~LOC~~ HOSPITAL LAB): SARS Coronavirus 2: NEGATIVE

## 2020-05-03 MED ORDER — CEFAZOLIN SODIUM-DEXTROSE 2-4 GM/100ML-% IV SOLN
2.0000 g | Freq: Once | INTRAVENOUS | Status: AC
  Administered 2020-05-03: 2 g via INTRAVENOUS
  Filled 2020-05-03: qty 100

## 2020-05-03 MED ORDER — ACETAMINOPHEN 500 MG PO TABS
1000.0000 mg | ORAL_TABLET | Freq: Once | ORAL | Status: AC
  Administered 2020-05-03: 1000 mg via ORAL
  Filled 2020-05-03: qty 2

## 2020-05-03 MED ORDER — LIDOCAINE HCL (PF) 1 % IJ SOLN
30.0000 mL | Freq: Once | INTRAMUSCULAR | Status: AC
  Administered 2020-05-03: 30 mL
  Filled 2020-05-03: qty 30

## 2020-05-03 MED ORDER — LACTATED RINGERS IV BOLUS
1000.0000 mL | Freq: Once | INTRAVENOUS | Status: AC
  Administered 2020-05-03: 1000 mL via INTRAVENOUS

## 2020-05-03 NOTE — ED Notes (Signed)
Patient transported to CT 

## 2020-05-03 NOTE — ED Provider Notes (Signed)
Saline loading test & subsequent laceration repair performed @ request of Dr. Ron Parker- please see his note for full H&P.   Supervising physician Dr. Tyrone Nine @ bedside for saline loading test w/ joint injection- in agreement with no fluid from laceration and therefore unlikely to involve the joint, proceed w/ closure. Procedure notes below.   .Joint Aspiration/Arthrocentesis  Date/Time: 05/03/2020 11:57 PM Performed by: Amaryllis Dyke, PA-C Authorized by: Amaryllis Dyke, PA-C   Consent:    Consent obtained:  Verbal   Consent given by:  Patient   Risks discussed:  Bleeding, incomplete drainage, nerve damage, infection, pain and poor cosmetic result   Alternatives discussed:  No treatment Location:    Location:  Knee   Knee:  L knee Anesthesia (see MAR for exact dosages):    Anesthesia method:  Local infiltration   Local anesthetic:  Lidocaine 1% w/o epi Procedure details:    Preparation: Patient was prepped and draped in usual sterile fashion     Needle gauge: 21G.   Ultrasound guidance: no     Approach:  Lateral   Aspirate characteristics:  Clear (straw colored)   Steroid injected: no     Specimen collected: no   Post-procedure details:    Patient tolerance of procedure:  Tolerated well, no immediate complications Comments:     Injected 100 cc of NS into the joint for evaluation of intra-articular involvement of anterior knee laceration, no fluid came from the laceration during procedure.  Curtis Parks.Laceration Repair  Date/Time: 05/04/2020 12:00 AM Performed by: Amaryllis Dyke, PA-C Authorized by: Amaryllis Dyke, PA-C   Consent:    Consent obtained:  Verbal   Consent given by:  Patient   Risks discussed:  Infection, need for additional repair, nerve damage, poor wound healing, poor cosmetic result and pain   Alternatives discussed:  No treatment Anesthesia (see MAR for exact dosages):    Anesthesia method:  Local infiltration   Local anesthetic:   Lidocaine 1% w/o epi Laceration details:    Location:  Leg   Leg location:  L knee   Length (cm):  4   Depth (mm):  4 Repair type:    Repair type:  Intermediate Pre-procedure details:    Preparation:  Patient was prepped and draped in usual sterile fashion and imaging obtained to evaluate for foreign bodies Exploration:    Hemostasis achieved with:  Cautery   Wound exploration: wound explored through full range of motion and entire depth of wound probed and visualized     Wound extent: fascia violated   Treatment:    Area cleansed with:  Betadine   Amount of cleaning:  Standard   Irrigation solution:  Sterile water   Irrigation volume:  2L   Irrigation method:  Pressure wash Fascia repair:    Suture size:  3-0   Suture material:  Vicryl   Suture technique:  Simple interrupted   Number of sutures:  3 Skin repair:    Repair method:  Sutures (skin repair performed by Jettie Booze PA-S2 under my supervision)   Suture size:  3-0   Suture material:  Nylon   Suture technique:  Simple interrupted   Number of sutures:  6 Approximation:    Approximation:  Close Post-procedure details:    Dressing:  Antibiotic ointment, non-adherent dressing and splint for protection   Patient tolerance of procedure:  Tolerated well, no immediate complications     Amaryllis Dyke, PA-C 05/04/20 0629    Deno Etienne, DO 05/04/20  0653  
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
## 2020-05-03 NOTE — ED Notes (Signed)
[employer] at bedside 
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
## 2020-05-04 ENCOUNTER — Encounter: Payer: Self-pay | Admitting: Family Medicine

## 2020-05-04 LAB — URINALYSIS, ROUTINE W REFLEX MICROSCOPIC
Bilirubin Urine: NEGATIVE
Glucose, UA: NEGATIVE mg/dL
Hgb urine dipstick: NEGATIVE
Ketones, ur: NEGATIVE mg/dL
Leukocytes,Ua: NEGATIVE
Nitrite: NEGATIVE
Protein, ur: NEGATIVE mg/dL
Specific Gravity, Urine: 1.026 (ref 1.005–1.030)
pH: 6 (ref 5.0–8.0)

## 2020-05-04 MED ORDER — BACITRACIN ZINC 500 UNIT/GM EX OINT
TOPICAL_OINTMENT | Freq: Two times a day (BID) | CUTANEOUS | Status: DC
  Administered 2020-05-04: 1 via TOPICAL
  Filled 2020-05-04: qty 2.7

## 2020-05-04 NOTE — ED Notes (Signed)
Family updated as to patient's status-wife at the bedside

## 2020-05-04 NOTE — Discharge Instructions (Signed)
You can take 600 mg of ibuprofen every 6 hours, you can take 1000 mg of Tylenol every 6 hours, you can alternate these every 3 or you can take them together.  Your sutures need to be removed in 1 week, you may come here, you can go to urgent care or you can go to your primary care provider

## 2020-05-04 NOTE — ED Notes (Signed)
Ortho tech at bedside 

## 2020-05-04 NOTE — Progress Notes (Signed)
Orthopedic Tech Progress Note Patient Details:  Curtis Parks 06/20/1981 [id number]  Ortho Devices Type of Ortho Device: Knee Immobilizer, Sling immobilizer Ortho Device/Splint Location: lue. lle Ortho Device/Splint Interventions: Ordered, Application, Adjustment   Post Interventions Patient Tolerated: Well Instructions Provided: Care of device, Adjustment of device   [name] 05/04/2020, 1:45 AM

## 2020-05-05 ENCOUNTER — Ambulatory Visit: Payer: BC Managed Care – PPO | Admitting: Orthopaedic Surgery

## 2020-05-05 VITALS — Ht 65.0 in | Wt 239.0 lb

## 2020-05-05 DIAGNOSIS — S43102A Unspecified dislocation of left acromioclavicular joint, initial encounter: Secondary | ICD-10-CM

## 2020-05-05 DIAGNOSIS — M25562 Pain in left knee: Secondary | ICD-10-CM | POA: Diagnosis not present

## 2020-05-05 DIAGNOSIS — S52125A Nondisplaced fracture of head of left radius, initial encounter for closed fracture: Secondary | ICD-10-CM

## 2020-05-05 MED ORDER — BUPIVACAINE HCL 0.25 % IJ SOLN
2.0000 mL | INTRAMUSCULAR | Status: AC | PRN
  Administered 2020-05-05: 2 mL via INTRA_ARTICULAR

## 2020-05-05 MED ORDER — HYDROCODONE-ACETAMINOPHEN 5-325 MG PO TABS: 1.0000 | ORAL_TABLET | Freq: Two times a day (BID) | ORAL | 0 refills | Status: DC | PRN

## 2020-05-05 MED ORDER — LIDOCAINE HCL 1 % IJ SOLN
2.0000 mL | INTRAMUSCULAR | Status: AC | PRN
  Administered 2020-05-05: 2 mL
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
## 2020-05-13 ENCOUNTER — Encounter: Payer: Self-pay | Admitting: Family Medicine

## 2020-05-13 ENCOUNTER — Ambulatory Visit (INDEPENDENT_AMBULATORY_CARE_PROVIDER_SITE_OTHER): Payer: BC Managed Care – PPO | Admitting: Family Medicine

## 2020-05-13 ENCOUNTER — Other Ambulatory Visit: Payer: Self-pay

## 2020-05-13 VITALS — BP 138/110 | HR 82 | Temp 98.1°F | Ht 65.0 in | Wt 227.0 lb

## 2020-05-13 DIAGNOSIS — S81012D Laceration without foreign body, left knee, subsequent encounter: Secondary | ICD-10-CM | POA: Diagnosis not present

## 2020-05-13 DIAGNOSIS — S43102D Unspecified dislocation of left acromioclavicular joint, subsequent encounter: Secondary | ICD-10-CM | POA: Diagnosis not present

## 2020-05-13 DIAGNOSIS — S52125D Nondisplaced fracture of head of left radius, subsequent encounter for closed fracture with routine healing: Secondary | ICD-10-CM

## 2020-05-13 MED ORDER — HYDROCODONE-ACETAMINOPHEN 5-325 MG PO TABS: 1.0000 | ORAL_TABLET | ORAL | 0 refills | Status: AC | PRN
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
## 2020-06-10 ENCOUNTER — Ambulatory Visit: Payer: BC Managed Care – PPO | Admitting: Orthopaedic Surgery

## 2020-06-12 ENCOUNTER — Ambulatory Visit (INDEPENDENT_AMBULATORY_CARE_PROVIDER_SITE_OTHER): Payer: BC Managed Care – PPO | Admitting: Orthopaedic Surgery

## 2020-06-12 ENCOUNTER — Other Ambulatory Visit: Payer: Self-pay

## 2020-06-12 ENCOUNTER — Ambulatory Visit: Payer: Self-pay

## 2020-06-12 ENCOUNTER — Encounter: Payer: Self-pay | Admitting: Orthopaedic Surgery

## 2020-06-12 DIAGNOSIS — S43102A Unspecified dislocation of left acromioclavicular joint, initial encounter: Secondary | ICD-10-CM

## 2020-06-12 DIAGNOSIS — M25562 Pain in left knee: Secondary | ICD-10-CM | POA: Diagnosis not present

## 2020-06-12 DIAGNOSIS — S52125A Nondisplaced fracture of head of left radius, initial encounter for closed fracture: Secondary | ICD-10-CM

## 2020-06-12 NOTE — Progress Notes (Signed)
Office Visit Note   Patient: Curtis Parks           Date of Birth: 1981-04-19           MRN: [id number] Visit Date: 06/12/2020              Requested by: Lannette Donath, DO [address] [place] [address] PCP: Lannette Donath, DO   Assessment & Plan: Visit Diagnoses:  1. Closed nondisplaced fracture of head of left radius, initial encounter   2. Acute pain of left knee   3. Separation of left acromioclavicular joint, initial encounter     Plan: Impression is healing left AC separation, left radial head fracture, left knee abrasion.  From my standpoint everything appears to be healing well and I explained that this will take several months for everything to feel normal.  He may use his left arm as tolerated and left as much as he can tolerate based on symptoms.  For the left knee he understands that the sensitivity will continue to improve as things heal.  He likely has a contusion to the patella.  Like to recheck him 1 more time in 6 weeks.  Follow-Up Instructions: Return in about 6 weeks (around 07/24/2020).   Orders:  Orders Placed This Encounter  Procedures  . XR Elbow Complete Left (3+View)   No orders of the defined types were placed in this encounter.     Procedures: No procedures performed   Clinical Data: No additional findings.   Subjective: Chief Complaint  Patient presents with  . Left Shoulder - Pain  . Left Knee - Pain    Mr. Curtis Parks returns today for follow-up of his left AC separation, left radial head fracture, left knee injury.  Overall he is feeling better but he feels some discomfort when he is trying to lift heavier things with his left arm.  He is unable to lay on his left side to sleep due to the shoulder pain.  His left knee is overall doing better but it is sensitive when he tries to kneel onto it.  He has noticed that he is able to grasp and grip things with his left hand better.   Review of Systems    Constitutional: Negative.   All other systems reviewed and are negative.    Objective: Vital Signs: There were no vitals taken for this visit.  Physical Exam Vitals and nursing note reviewed.  Constitutional:      Appearance: He is well-developed.  Pulmonary:     Effort: Pulmonary effort is normal.  Abdominal:     Palpations: Abdomen is soft.  Skin:    General: Skin is warm.  Neurological:     Mental Status: He is alert and oriented to person, place, and time.  Psychiatric:        Behavior: Behavior normal.        Thought Content: Thought content normal.        Judgment: Judgment normal.     Ortho Exam Left shoulder shows stable AC separation.  He has excellent range of motion of the shoulder without any pain.  No significant tenderness of the AC joint.  Strength is improving and mainly limited by discomfort.  Left elbow shows good range of motion with minimal tenderness of the radial head.  Full pronation supination.  Left knee shows a healing abrasion.  Slight effusion of the prepatellar bursa.  No signs of infection.  No joint  effusion.  Normal range of motion.  Specialty Comments:  No specialty comments available.  Imaging: XR Elbow Complete Left (3+View)  Result Date: 06/12/2020 Healing radial head fracture    PMFS History: Patient Active Problem List   Diagnosis Date Noted  . Genital herpes 03/04/2020  . Umbilical hernia 30/17/2091   History reviewed. No pertinent past medical history.  History reviewed. No pertinent family history.  History reviewed. No pertinent surgical history. Social History   Occupational History  . Not on file  Tobacco Use  . Smoking status: Never Smoker  . Smokeless tobacco: Never Used  Substance and Sexual Activity  . Alcohol use: Yes    Comment: social  . Drug use: Not Currently  . Sexual activity: Not on file

## 2020-12-08 ENCOUNTER — Other Ambulatory Visit: Payer: Self-pay

## 2020-12-09 ENCOUNTER — Ambulatory Visit: Payer: BC Managed Care – PPO | Admitting: Family Medicine

## 2020-12-14 ENCOUNTER — Ambulatory Visit: Payer: BC Managed Care – PPO | Admitting: Family Medicine

## 2020-12-17 ENCOUNTER — Other Ambulatory Visit: Payer: Self-pay

## 2020-12-18 ENCOUNTER — Encounter: Payer: Self-pay | Admitting: Family Medicine

## 2020-12-18 ENCOUNTER — Ambulatory Visit: Payer: BC Managed Care – PPO | Admitting: Family Medicine

## 2020-12-18 VITALS — BP 130/92 | HR 83 | Temp 98.3°F | Wt 215.0 lb

## 2020-12-18 DIAGNOSIS — M25512 Pain in left shoulder: Secondary | ICD-10-CM

## 2020-12-18 DIAGNOSIS — Z309 Encounter for contraceptive management, unspecified: Secondary | ICD-10-CM | POA: Diagnosis not present

## 2020-12-18 DIAGNOSIS — J3089 Other allergic rhinitis: Secondary | ICD-10-CM | POA: Diagnosis not present

## 2020-12-18 DIAGNOSIS — G8929 Other chronic pain: Secondary | ICD-10-CM | POA: Diagnosis not present

## 2020-12-18 MED ORDER — IBUPROFEN 800 MG PO TABS: 800.0000 mg | ORAL_TABLET | Freq: Four times a day (QID) | ORAL | 5 refills | Status: AC | PRN

## 2020-12-18 NOTE — Progress Notes (Signed)
   Subjective:    Patient ID: Curtis Parks, male    DOB: 1981/01/25, 40 y.o.   MRN: [id number]  HPI Here for several issues. First he asks for a referral for a vasectomy. He has 8 children and he wants to avoid any more. Also he has chronic pain in the left shoulder. Dr. Erlinda Hong had been giving him Ibuprofen 800 mg for this and he asks if we can assume this. Also his allergies have been bothering him this spring causing sneezing, itchy eyes and stuffy nose. He has tried Investment banker, operational with poor response.    Review of Systems  Constitutional: Negative.   HENT: Positive for congestion, postnasal drip and sinus pressure. Negative for ear pain and sore throat.   Eyes: Positive for redness and itching. Negative for discharge.  Respiratory: Negative.   Cardiovascular: Negative.   Musculoskeletal: Positive for arthralgias.       Objective:   Physical Exam Constitutional:      Appearance: Normal appearance.  HENT:     Right Ear: Tympanic membrane, ear canal and external ear normal.     Left Ear: Tympanic membrane, ear canal and external ear normal.     Nose: Nose normal.     Mouth/Throat:     Pharynx: Oropharynx is clear.  Eyes:     Conjunctiva/sclera: Conjunctivae normal.  Cardiovascular:     Rate and Rhythm: Normal rate and regular rhythm.     Pulses: Normal pulses.     Heart sounds: Normal heart sounds.  Pulmonary:     Effort: Pulmonary effort is normal.     Breath sounds: Normal breath sounds.  Lymphadenopathy:     Cervical: No cervical adenopathy.  Neurological:     Mental Status: He is alert.           Assessment & Plan:  We will refer him to Urology for the vasectomy. Write for Ibuprofen 800 mg prn for the shoulder pain. Try both Xyzal and Flonase sprays OTC for the allergies.  Alysia Penna, MD

## 2021-01-14 IMAGING — CT CT CERVICAL SPINE W/O CM
4 of 5 series · 16 of 35 positions shown, 18 images · non-contrast
Comparison: None.

CLINICAL DATA: Status post trauma.

EXAM:
CT CERVICAL SPINE WITHOUT CONTRAST
TECHNIQUE: Multidetector CT imaging of the cervical spine was performed without
intravenous contrast. Multiplanar CT image reconstructions were also
generated.

[Series 6: c spine soft · axial · 0.39mm/px · z∈[-256,-142]mm · 5 of 87 slices shown]
[im 15/87  soft-tissue]
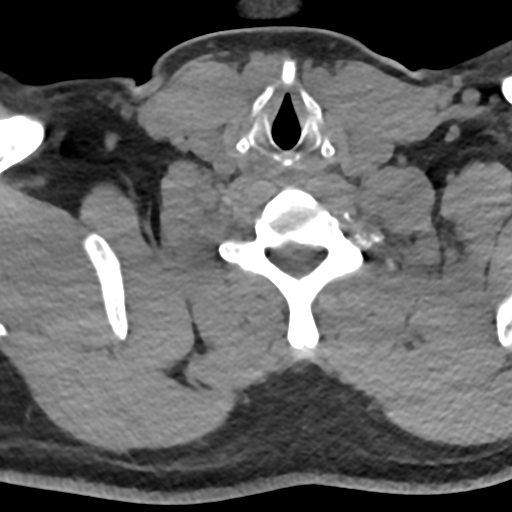
[im 29/87  soft-tissue]
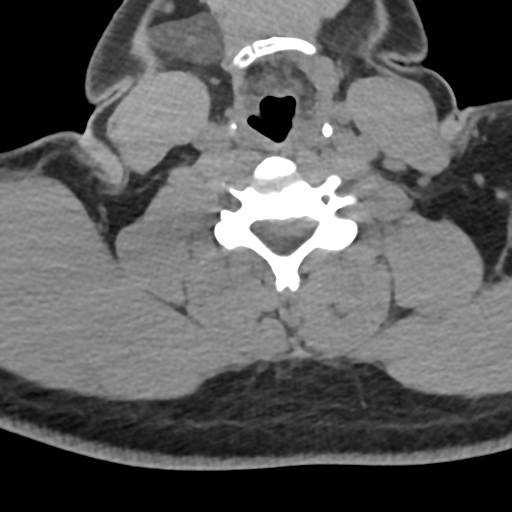
[im 44/87  soft-tissue]
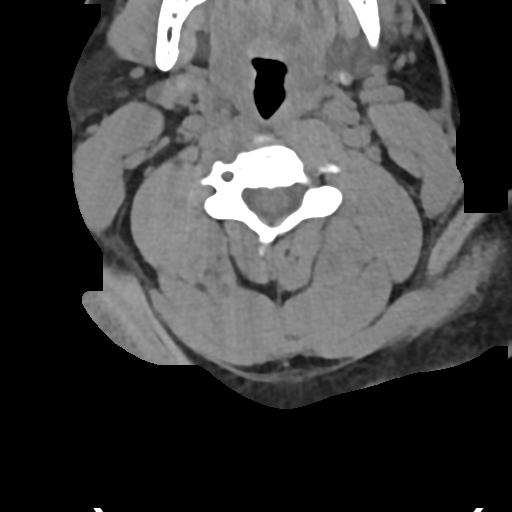
[im 58/87  soft-tissue]
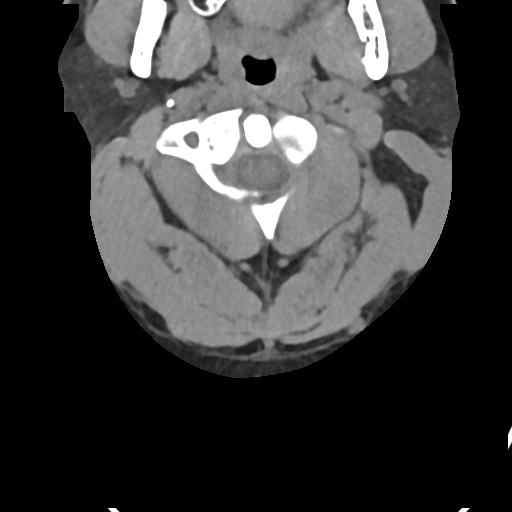
[im 72/87  soft-tissue]
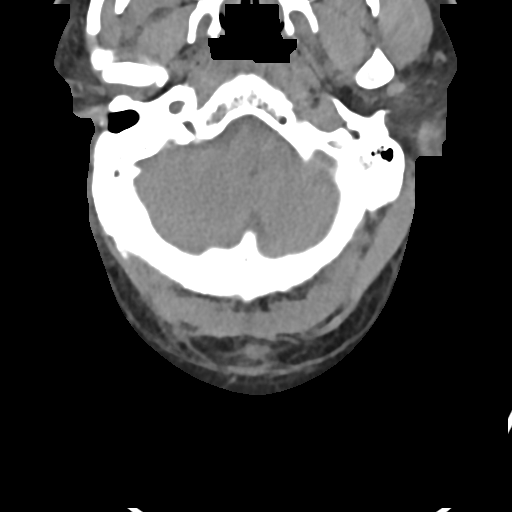

[Series 9: sag bone · sagittal · 0.33mm/px · 5 of 94 slices shown, 6 images]
[im 32/94  bone]
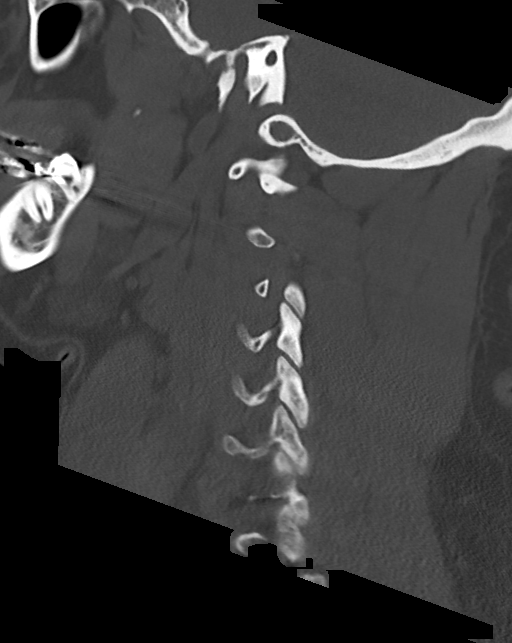
[im 39/94  bone]
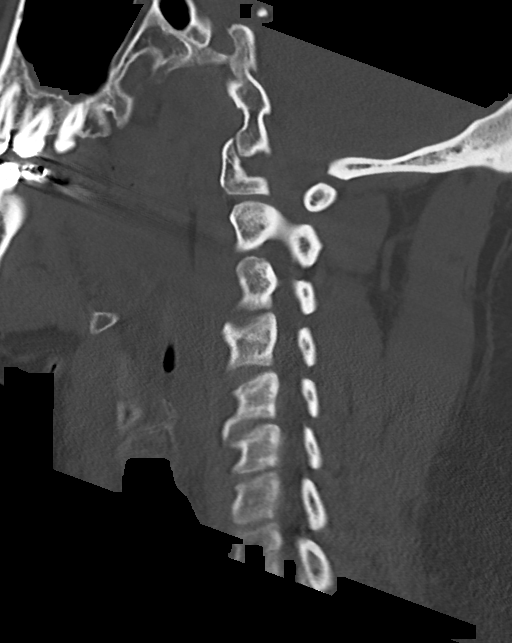
[im 47/94  soft-tissue]
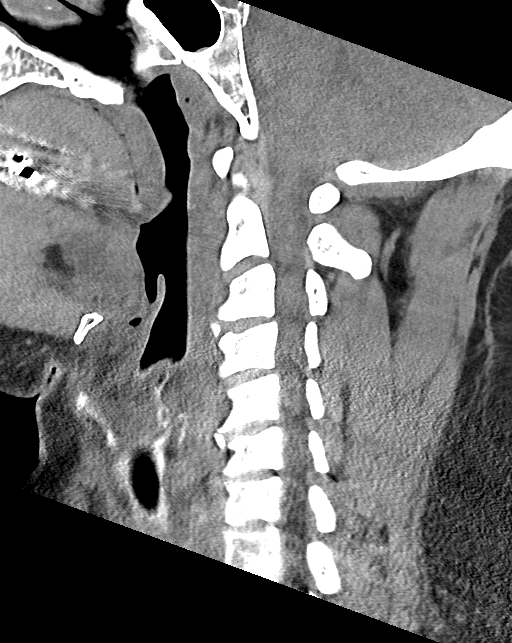
[im 47/94  bone]
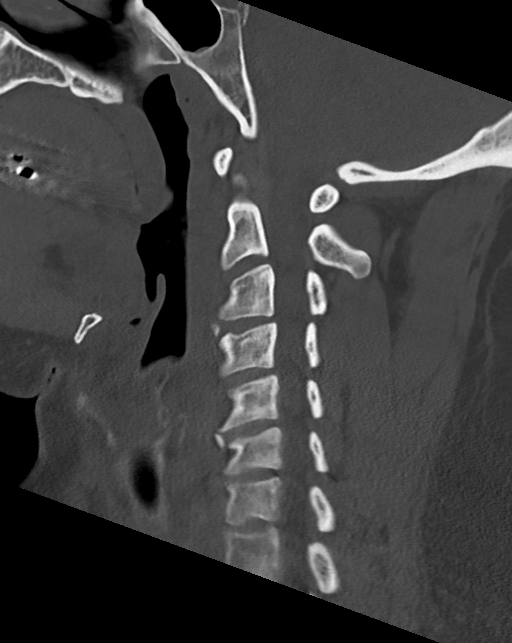
[im 55/94  bone]
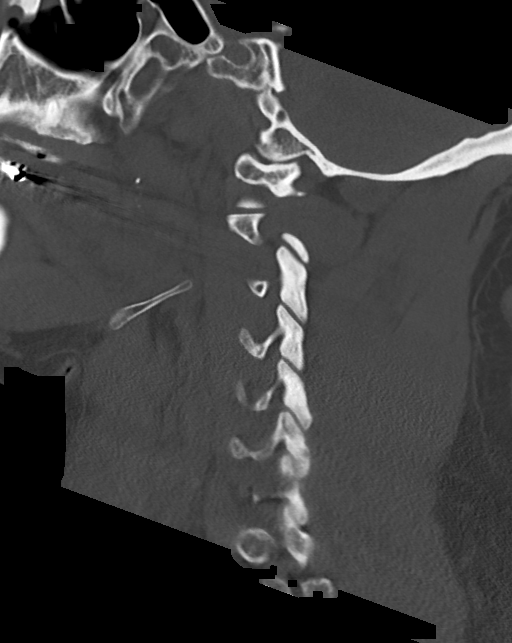
[im 63/94  bone]
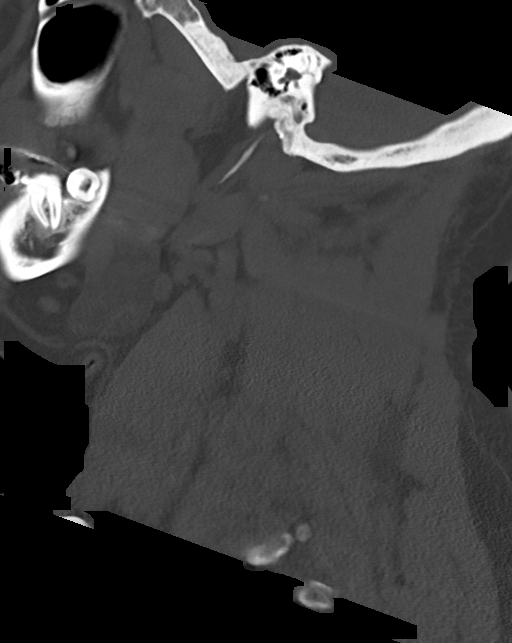

[Series 10: cor bone · coronal · 0.35mm/px · 3 of 78 slices shown]
[im 17/78  bone]
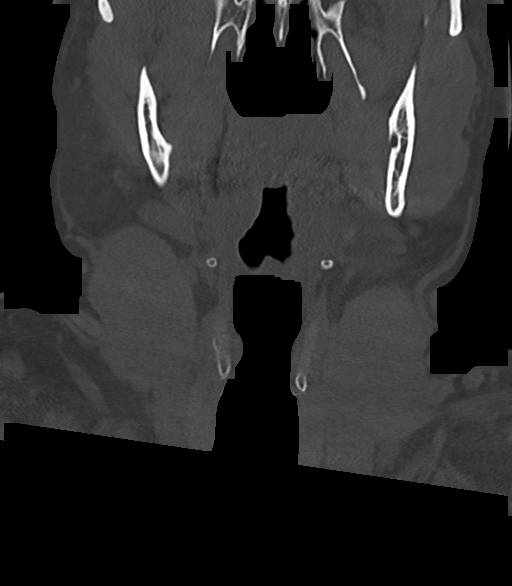
[im 32/78  bone]
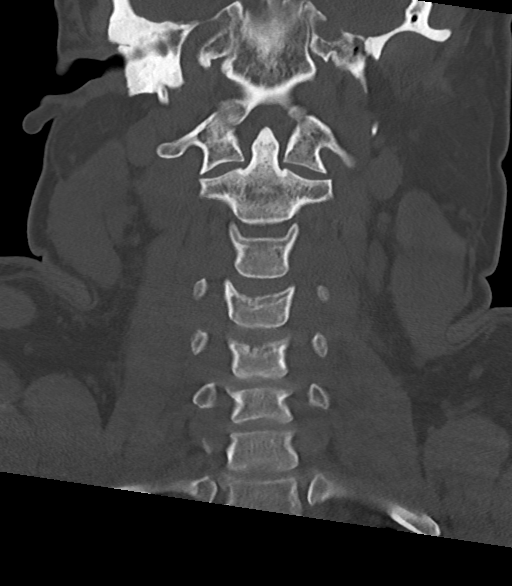
[im 46/78  bone]
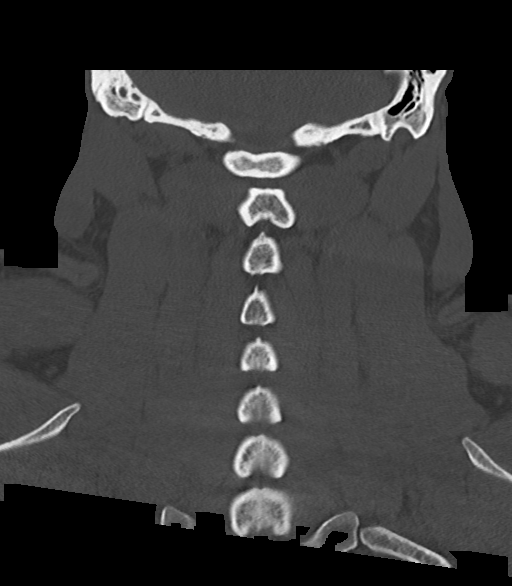

[Series 11: orthogonal axials · axial · 0.21mm/px · z∈[-259,-199]mm · 3 of 71 slices shown, 4 images]
[im 18/71  soft-tissue]
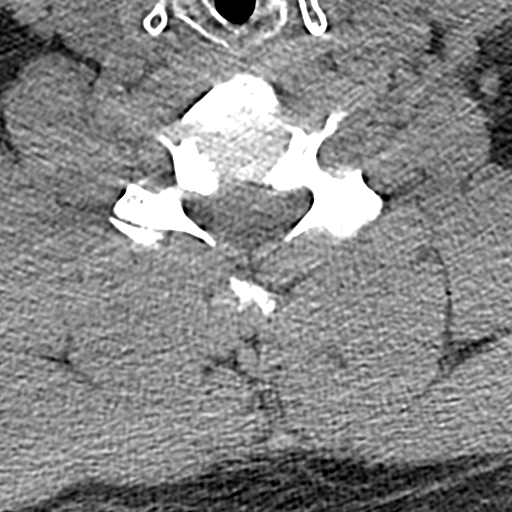
[im 18/71  bone]
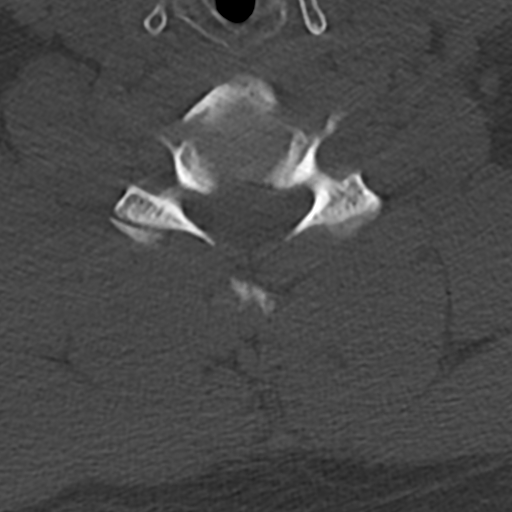
[im 36/71  bone]
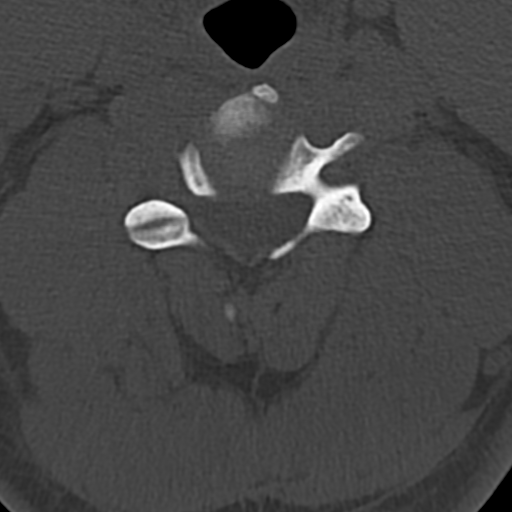
[im 53/71  bone]
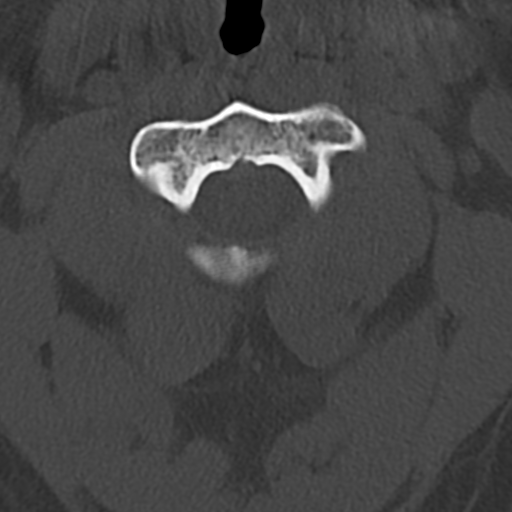

[16 of 35 positions shown; findings below may reference images not displayed]

FINDINGS: Alignment: Normal.

Skull base and vertebrae: No acute fracture. No primary bone lesion
or focal pathologic process.

Soft tissues and spinal canal: No prevertebral fluid or swelling. No
visible canal hematoma.

Disc levels: Mild to moderate severity anterior osteophyte formation
is seen at the level of C5-C6. Mild anterior osteophyte formation is
noted at the level of C3-C4.

Normal multilevel intervertebral disc spaces are seen.

Normal bilateral multilevel facet joints are noted.

Upper chest: Negative.

Other: None.
IMPRESSION: 1. Mild to moderate severity degenerative changes at the level of
C5-C6.
2. No evidence of an acute fracture or subluxation.

## 2024-09-13 ENCOUNTER — Encounter: Payer: Self-pay | Admitting: Family Medicine

## 2024-09-13 ENCOUNTER — Ambulatory Visit (INDEPENDENT_AMBULATORY_CARE_PROVIDER_SITE_OTHER): Payer: Self-pay | Admitting: Family Medicine

## 2024-09-13 ENCOUNTER — Ambulatory Visit: Payer: Self-pay | Admitting: Family Medicine

## 2024-09-13 VITALS — BP 130/84 | HR 82 | Temp 97.9°F | Ht 64.0 in | Wt 245.0 lb

## 2024-09-13 DIAGNOSIS — R5383 Other fatigue: Secondary | ICD-10-CM

## 2024-09-13 DIAGNOSIS — E559 Vitamin D deficiency, unspecified: Secondary | ICD-10-CM

## 2024-09-13 DIAGNOSIS — Z7689 Persons encountering health services in other specified circumstances: Secondary | ICD-10-CM

## 2024-09-13 LAB — CBC WITH DIFFERENTIAL/PLATELET
Basophils Absolute: 0 10*3/uL (ref 0.0–0.1)
Basophils Relative: 0.8 % (ref 0.0–3.0)
Eosinophils Absolute: 0 10*3/uL (ref 0.0–0.7)
Eosinophils Relative: 1.2 % (ref 0.0–5.0)
HCT: 39.7 % (ref 39.0–52.0)
Hemoglobin: 12.8 g/dL — ABNORMAL LOW (ref 13.0–17.0)
Lymphocytes Relative: 44.4 % (ref 12.0–46.0)
Lymphs Abs: 1.7 10*3/uL (ref 0.7–4.0)
MCHC: 32.1 g/dL (ref 30.0–36.0)
MCV: 78.9 fl (ref 78.0–100.0)
Monocytes Absolute: 0.3 10*3/uL (ref 0.1–1.0)
Monocytes Relative: 8.9 % (ref 3.0–12.0)
Neutro Abs: 1.8 10*3/uL (ref 1.4–7.7)
Neutrophils Relative %: 44.7 % (ref 43.0–77.0)
Platelets: 192 10*3/uL (ref 150.0–400.0)
RBC: 5.04 Mil/uL (ref 4.22–5.81)
RDW: 15 % (ref 11.5–15.5)
WBC: 3.9 10*3/uL — ABNORMAL LOW (ref 4.0–10.5)

## 2024-09-13 LAB — COMPREHENSIVE METABOLIC PANEL WITH GFR
ALT: 47 U/L (ref 3–53)
AST: 30 U/L (ref 5–37)
Albumin: 4.4 g/dL (ref 3.5–5.2)
Alkaline Phosphatase: 62 U/L (ref 39–117)
BUN: 14 mg/dL (ref 6–23)
CO2: 29 meq/L (ref 19–32)
Calcium: 9.9 mg/dL (ref 8.4–10.5)
Chloride: 101 meq/L (ref 96–112)
Creatinine, Ser: 1.44 mg/dL (ref 0.40–1.50)
GFR: 58.99 mL/min — ABNORMAL LOW
Glucose, Bld: 126 mg/dL — ABNORMAL HIGH (ref 70–99)
Potassium: 4.1 meq/L (ref 3.5–5.1)
Sodium: 136 meq/L (ref 135–145)
Total Bilirubin: 0.6 mg/dL (ref 0.2–1.2)
Total Protein: 7.8 g/dL (ref 6.0–8.3)

## 2024-09-13 LAB — LIPID PANEL
Cholesterol: 167 mg/dL (ref 28–200)
HDL: 38 mg/dL — ABNORMAL LOW
LDL Cholesterol: 114 mg/dL — ABNORMAL HIGH (ref 10–99)
NonHDL: 128.99
Total CHOL/HDL Ratio: 4
Triglycerides: 77 mg/dL (ref 10.0–149.0)
VLDL: 15.4 mg/dL (ref 0.0–40.0)

## 2024-09-13 LAB — VITAMIN B12: Vitamin B-12: 530 pg/mL (ref 211–911)

## 2024-09-13 LAB — TESTOSTERONE: Testosterone: 258.96 ng/dL — ABNORMAL LOW (ref 300.00–890.00)

## 2024-09-13 LAB — VITAMIN D 25 HYDROXY (VIT D DEFICIENCY, FRACTURES): VITD: 20.85 ng/mL — ABNORMAL LOW (ref 30.00–100.00)

## 2024-09-13 LAB — TSH: TSH: 1.55 u[IU]/mL (ref 0.35–5.50)

## 2024-09-13 LAB — HEMOGLOBIN A1C: Hgb A1c MFr Bld: 6.8 % — ABNORMAL HIGH (ref 4.6–6.5)

## 2024-09-13 NOTE — Progress Notes (Signed)
 "  New Patient Office Visit  Subjective   Patient ID: Curtis Parks, male    DOB: [birth year]  Age: 44 y.o. MRN: [id number]  CC:  Chief Complaint  Patient presents with   Establish Care    HPI Curtis Parks presents to establish care with new provider.  Patients previous primary care provider: [facility] [place] with Dr. Garnette Olmsted. Last seen in 2021.  Specialist: None   Patient reports want to establish primary care again, lose weight, and keep an eye on his health. He reports recent lifestyle changes of a better diet and exercise. He reports excessive daytime sleepiness, however, feels like he gets enough sleep through the night. He has stopped eating at nighttime and has realized that this helps him sleep throughout the night. He has concerns because his father passed away at the age of 45yr from diabetes and complications. He currently does not take any medications.   Outpatient Encounter Medications as of 09/13/2024  Medication Sig   ibuprofen  (ADVIL ) 800 MG tablet Take 1 tablet (800 mg total) by mouth every 6 (six) hours as needed for moderate pain.   No facility-administered encounter medications on file as of 09/13/2024.   History reviewed. No pertinent past medical history.  History reviewed. No pertinent surgical history.  Family History  Problem Relation Age of Onset   Diabetes Father        Passed away    Social History   Socioeconomic History   Marital status: Single    Spouse name: Not on file   Number of children: Not on file   Years of education: Not on file   Highest education level: Not on file  Occupational History   Not on file  Tobacco Use   Smoking status: Never    Passive exposure: Never   Smokeless tobacco: Never  Substance and Sexual Activity   Alcohol use: Not Currently    Comment: social   Drug use: Not Currently   Sexual activity: Yes  Other Topics Concern   Not on file  Social History Narrative   **  Merged History Encounter **       Social Drivers of Health   Tobacco Use: Low Risk (09/13/2024)   Patient History    Smoking Tobacco Use: Never    Smokeless Tobacco Use: Never    Passive Exposure: Never  Financial Resource Strain: Low Risk (09/13/2024)   Overall Financial Resource Strain (CARDIA)    Difficulty of Paying Living Expenses: Not hard at all  Food Insecurity: No Food Insecurity (09/13/2024)   Epic    Worried About Radiation Protection Practitioner of Food in the Last Year: Never true    Ran Out of Food in the Last Year: Never true  Transportation Needs: No Transportation Needs (09/13/2024)   Epic    Lack of Transportation (Medical): No    Lack of Transportation (Non-Medical): No  Physical Activity: Sufficiently Active (09/13/2024)   Exercise Vital Sign    Days of Exercise per Week: 5 days    Minutes of Exercise per Session: 40 min  Stress: No Stress Concern Present (09/13/2024)   [employer] of Occupational Health - Occupational Stress Questionnaire    Feeling of Stress: Not at all  Social Connections: Moderately Integrated (09/13/2024)   Social Connection and Isolation Panel    Frequency of Communication with Friends and Family: More than three times a week    Frequency of Social Gatherings with Friends and Family: Three times a  week    Attends Religious Services: 1 to 4 times per year    Active Member of Clubs or Organizations: No    Attends Banker Meetings: Never    Marital Status: Married  Catering Manager Violence: Not At Risk (09/13/2024)   Epic    Fear of Current or Ex-Partner: No    Emotionally Abused: No    Physically Abused: No    Sexually Abused: No  Depression (PHQ2-9): Low Risk (09/13/2024)   Depression (PHQ2-9)    PHQ-2 Score: 0  Alcohol Screen: Low Risk (09/13/2024)   Alcohol Screen    Last Alcohol Screening Score (AUDIT): 0  Housing: Low Risk (09/13/2024)   Epic    Unable to Pay for Housing in the Last Year: No    Number of Times Moved in the Last  Year: 0    Homeless in the Last Year: No  Utilities: Not At Risk (09/13/2024)   Epic    Threatened with loss of utilities: No  Health Literacy: Adequate Health Literacy (09/13/2024)   B1300 Health Literacy    Frequency of need for help with medical instructions: Never   Review of Systems  Constitutional: Negative.   HENT: Negative.    Eyes: Negative.   Respiratory: Negative.    Cardiovascular: Negative.   Gastrointestinal: Negative.   Genitourinary: Negative.   Musculoskeletal: Negative.   Skin: Negative.   Neurological: Negative.   Psychiatric/Behavioral: Negative.       Objective  BP 130/84   Pulse 82   Temp 97.9 F (36.6 C) (Oral)   Ht 5' 4 (1.626 m)   Wt 245 lb (111.1 kg)   SpO2 96%   BMI 42.05 kg/m   Physical Exam Constitutional:      Appearance: Normal appearance. He is obese.  HENT:     Head: Normocephalic.  Pulmonary:     Effort: Pulmonary effort is normal.  Skin:    General: Skin is warm and dry.  Neurological:     General: No focal deficit present.     Mental Status: He is alert and oriented to person, place, and time. Mental status is at baseline.  Psychiatric:        Mood and Affect: Mood normal.        Behavior: Behavior normal.        Thought Content: Thought content normal.        Judgment: Judgment normal.     Assessment & Plan:  Fatigue, unspecified type -     CBC with Differential/Platelet -     Comprehensive metabolic panel with GFR -     Hemoglobin A1c -     Testosterone -     TSH -     Vitamin B12 -     VITAMIN D 25 Hydroxy (Vit-D Deficiency, Fractures)  Morbidly obese (HCC) -     CBC with Differential/Platelet -     Comprehensive metabolic panel with GFR -     Hemoglobin A1c -     Lipid panel -     Testosterone -     TSH -     Vitamin B12 -     VITAMIN D 25 Hydroxy (Vit-D Deficiency, Fractures)  Establishing care with new doctor, encounter for  Health Maintenance: -Tdap: Denies. -Hep B: Denies. -Influenza:  Denies. -COVID: Denies.  -Continue healthy lifestyle changes of diet and exercise. -Ordered labs based on BMI-obese and complaints of fatigue. Office will call with lab results and will be available via MyChart.  -  Discussed about possibly referring to pulmonary for a sleep apnea testing as a reason for fatigue. Will hold off on referral until lab results are available.  -Will follow based on lab results.   Clifton Kovacic, NP "

## 2024-09-13 NOTE — Patient Instructions (Addendum)
 It was nice to meet you today, and I look forward to taking care of you Today we: -Established care with new primary care provider. -Discussed health maintenance. -Continue healthy lifestyle changes of diet and exercise. -Ordered labs CBC, CMP, A1c, Testosterone, TSH, Vitamin B12, Vitamin D. Office will call with lab results and will be available via MyChart.  -Will follow based on lab results.

## 2024-09-17 MED ORDER — VITAMIN D3 1.25 MG (50000 UT) PO CAPS: 1.2500 mg | ORAL_CAPSULE | ORAL | 0 refills | Status: AC

## 2024-09-18 ENCOUNTER — Encounter: Payer: Self-pay | Admitting: Family Medicine

## 2024-09-18 ENCOUNTER — Telehealth: Payer: Self-pay | Admitting: Family Medicine

## 2024-09-18 VITALS — Ht 64.0 in | Wt 245.0 lb

## 2024-09-18 DIAGNOSIS — E1169 Type 2 diabetes mellitus with other specified complication: Secondary | ICD-10-CM

## 2024-09-18 DIAGNOSIS — E1165 Type 2 diabetes mellitus with hyperglycemia: Secondary | ICD-10-CM

## 2024-09-18 DIAGNOSIS — R7989 Other specified abnormal findings of blood chemistry: Secondary | ICD-10-CM

## 2024-09-18 DIAGNOSIS — N289 Disorder of kidney and ureter, unspecified: Secondary | ICD-10-CM

## 2024-09-18 DIAGNOSIS — E559 Vitamin D deficiency, unspecified: Secondary | ICD-10-CM

## 2024-09-18 NOTE — Progress Notes (Signed)
 Virtual Visit via Video Note  I connected with Curtis Parks on 09/18/24 at 8:54 AM by a video enabled telemedicine application and verified that I am speaking with the correct person using two identifiers.   Patient Location: Home Provider Location: office - [place].    I discussed the limitations, risks, security and privacy concerns of performing an evaluation and management service by telephone and the availability of in person appointments. I also discussed with the patient that there may be a patient responsible charge related to this service. The patient expressed understanding and agreed to proceed, consent obtained  Chief Complaint  Patient presents with   Medical Management of Chronic Issues    Diabetic treatment.    History of Present Illness: Curtis Parks is a 44 y.o. male following up on abnormal lab results and discussing about treatment.   A1c was considered diabetic, 6.8, and not taking any medication. He reports since the start of the year, he has been exercising more on a regular basis. However, he needs help with nutrition.   Cholesterol is elevated and not taking medications.  Lab Results  Component Value Date   CHOL 167 09/13/2024   HDL 38.00 (L) 09/13/2024   LDLCALC 114 (H) 09/13/2024   TRIG 77.0 09/13/2024   CHOLHDL 4 09/13/2024    Testosterone was low, 258.96.   Vitamin D was low. He is planning to start prescribed supplement today. He reports he is going to pick up medication from pharmacy.   Kidney function was slightly low. Recommended to hydrate with 64-100oz of water a day.    Patient Active Problem List   Diagnosis Date Noted   Chronic left shoulder pain 12/18/2020   Environmental and seasonal allergies 12/18/2020   Genital herpes 03/04/2020   Umbilical hernia 03/20/2014   No past medical history on file. No past surgical history on file. Allergies[1] Prior to Admission medications  Medication Sig Start Date End Date Taking?  Authorizing Provider  Cholecalciferol (VITAMIN D3) 1.25 MG (50000 UT) CAPS Take 1 capsule (1.25 mg total) by mouth every 7 (seven) days. 09/17/24  Yes Billy Philippe SAUNDERS, NP  ibuprofen  (ADVIL ) 800 MG tablet Take 1 tablet (800 mg total) by mouth every 6 (six) hours as needed for moderate pain. 12/18/20  Yes Johnny Garnette LABOR, MD   Social History   Socioeconomic History   Marital status: Single    Spouse name: Not on file   Number of children: Not on file   Years of education: Not on file   Highest education level: Not on file  Occupational History   Not on file  Tobacco Use   Smoking status: Never    Passive exposure: Never   Smokeless tobacco: Never  Substance and Sexual Activity   Alcohol use: Not Currently    Comment: social   Drug use: Not Currently   Sexual activity: Yes  Other Topics Concern   Not on file  Social History Narrative   ** Merged History Encounter **       Social Drivers of Health   Tobacco Use: Low Risk (09/18/2024)   Patient History    Smoking Tobacco Use: Never    Smokeless Tobacco Use: Never    Passive Exposure: Never  Financial Resource Strain: Low Risk (09/13/2024)   Overall Financial Resource Strain (CARDIA)    Difficulty of Paying Living Expenses: Not hard at all  Food Insecurity: No Food Insecurity (09/13/2024)   Epic    Worried About Running Out of  Food in the Last Year: Never true    Ran Out of Food in the Last Year: Never true  Transportation Needs: No Transportation Needs (09/13/2024)   Epic    Lack of Transportation (Medical): No    Lack of Transportation (Non-Medical): No  Physical Activity: Sufficiently Active (09/13/2024)   Exercise Vital Sign    Days of Exercise per Week: 5 days    Minutes of Exercise per Session: 40 min  Stress: No Stress Concern Present (09/13/2024)   [employer] of Occupational Health - Occupational Stress Questionnaire    Feeling of Stress: Not at all  Social Connections: Moderately Integrated (09/13/2024)    Social Connection and Isolation Panel    Frequency of Communication with Friends and Family: More than three times a week    Frequency of Social Gatherings with Friends and Family: Three times a week    Attends Religious Services: 1 to 4 times per year    Active Member of Clubs or Organizations: No    Attends Banker Meetings: Never    Marital Status: Married  Catering Manager Violence: Not At Risk (09/13/2024)   Epic    Fear of Current or Ex-Partner: No    Emotionally Abused: No    Physically Abused: No    Sexually Abused: No  Depression (PHQ2-9): Low Risk (09/18/2024)   Depression (PHQ2-9)    PHQ-2 Score: 0  Alcohol Screen: Low Risk (09/13/2024)   Alcohol Screen    Last Alcohol Screening Score (AUDIT): 0  Housing: Low Risk (09/13/2024)   Epic    Unable to Pay for Housing in the Last Year: No    Number of Times Moved in the Last Year: 0    Homeless in the Last Year: No  Utilities: Not At Risk (09/13/2024)   Epic    Threatened with loss of utilities: No  Health Literacy: Adequate Health Literacy (09/13/2024)   B1300 Health Literacy    Frequency of need for help with medical instructions: Never    Observations/Objective: Today's Vitals   09/18/24 0723  Weight: 245 lb (111.1 kg)  Height: 5' 4 (1.626 m)  Unable to obtain full set of vital signs due to equipment not available.  Physical Exam Vitals reviewed.  Constitutional:      General: He is not in acute distress.    Appearance: Normal appearance. He is not ill-appearing, toxic-appearing or diaphoretic.  HENT:     Head: Normocephalic and atraumatic.  Eyes:     General:        Right eye: No discharge.        Left eye: No discharge.     Conjunctiva/sclera: Conjunctivae normal.  Pulmonary:     Effort: Pulmonary effort is normal. No respiratory distress.  Skin:    General: Skin is dry.  Neurological:     General: No focal deficit present.     Mental Status: He is alert and oriented to person, place, and time.  Mental status is at baseline.  Psychiatric:        Mood and Affect: Mood normal.        Behavior: Behavior normal.        Thought Content: Thought content normal.        Judgment: Judgment normal.     Assessment and Plan: Type 2 diabetes mellitus with hyperglycemia, without long-term current use of insulin (HCC) -     Amb Referral to Nutrition and Diabetic Education  Hyperlipidemia associated with type 2 diabetes mellitus (HCC)  Vitamin D deficiency  Low serum testosterone  Function kidney decreased  -Discussed about lab results.  -As for elevated A1c, diabetes.  *Placed a referral to nutrition and diabetic education. Please call the office or send a MyChart message if you do not receive a phone call or a MyChart message about appointment in 2 weeks.  *Recommend to continue to participate in regular exercise.  *Visit American Diabetes Association website for help with diabetes. *Provided general information about diabetes to review on MyChart. *Recommend a yearly diabetic eye exam by ophthalmology. *Discussed about foot care.  *Decided to work on lifestyle changes and not start medication.   -As for elevated cholesterol  *Decided to work on lifestyle changes and not start medication.   -As for vitamin D deficiency *Start prescribed supplement and recheck in 3 months.   -At next visit, will recheck testosterone. Recommend to make an early AM appointment and will  check other labs to evaluate kidney function, low hemoglobin level. Recommend to hydrate with water, 64-100oz of water a day.   Follow Up Instructions: Return in about 3 months (around 12/16/2024) for chronic management.   I discussed the assessment and treatment plan with the patient. The patient was provided an opportunity to ask questions and all were answered. The patient agreed with the plan and demonstrated an understanding of the instructions.   The patient was advised to call back or seek an in-person  evaluation if the symptoms worsen or if the condition fails to improve as anticipated.  Glorene Leitzke, NP     [1]  Allergies Allergen Reactions   Azithromycin      Zpack caused abdominal pain

## 2024-09-18 NOTE — Patient Instructions (Addendum)
-  It was good to see you today. I appreciate you meeting with me through telehealth this morning.   -Discussed about lab results.  -As for elevated A1c, diabetes.  *Placed a referral to nutrition and diabetic education. Please call the office or send a MyChart message if you do not receive a phone call or a MyChart message about appointment in 2 weeks.  *Recommend to continue to participate in regular exercise.  *Visit American Diabetes Association website for help with diabetes. *Provided general information about diabetes to review on MyChart. *Recommend a yearly diabetic eye exam by ophthalmology. *Discussed about foot care.  *Decided to work on lifestyle changes and not start medication.   -As for elevated cholesterol  *Decided to work on lifestyle changes and not start medication.   -As for vitamin D deficiency *Start prescribed supplement and recheck in 3 months.   -At next visit, will recheck testosterone. Recommend to make an early AM appointment and will check other labs to evaluate kidney function, low hemoglobin level. Recommend to hydrate with water, 64-100oz of water a day.   -Follow up in 3 months for chronic management and please be fasting.

## 2024-10-03 ENCOUNTER — Encounter: Payer: Self-pay | Admitting: Dietician

## 2024-12-16 ENCOUNTER — Ambulatory Visit: Payer: Self-pay | Admitting: Family Medicine
